# Patient Record
Sex: Male | Born: 1970 | Race: White | Hispanic: No | Marital: Single | Smoking: Current every day smoker
Health system: Southern US, Community
[De-identification: ages and names within clinical notes are randomized; demographics above are authoritative.]

## PROBLEM LIST (undated history)

## (undated) DIAGNOSIS — J449 Chronic obstructive pulmonary disease, unspecified: Secondary | ICD-10-CM

## (undated) DIAGNOSIS — Z87442 Personal history of urinary calculi: Secondary | ICD-10-CM

## (undated) HISTORY — PX: TONSILLECTOMY: SUR1361

---

## 2016-12-16 ENCOUNTER — Other Ambulatory Visit: Payer: Self-pay

## 2016-12-16 ENCOUNTER — Emergency Department (HOSPITAL_COMMUNITY)
Admission: EM | Admit: 2016-12-16 | Discharge: 2016-12-16 | Disposition: A | Discharge status: Home / Self Care | Payer: Worker's Compensation | Attending: Emergency Medicine | Admitting: Emergency Medicine

## 2016-12-16 ENCOUNTER — Emergency Department (HOSPITAL_COMMUNITY): Payer: Worker's Compensation

## 2016-12-16 ENCOUNTER — Encounter (HOSPITAL_COMMUNITY): Payer: Self-pay | Admitting: Emergency Medicine

## 2016-12-16 DIAGNOSIS — R0789 Other chest pain: Secondary | ICD-10-CM | POA: Diagnosis not present

## 2016-12-16 DIAGNOSIS — S299XXA Unspecified injury of thorax, initial encounter: Secondary | ICD-10-CM | POA: Diagnosis present

## 2016-12-16 DIAGNOSIS — F17200 Nicotine dependence, unspecified, uncomplicated: Secondary | ICD-10-CM | POA: Insufficient documentation

## 2016-12-16 DIAGNOSIS — Y92410 Unspecified street and highway as the place of occurrence of the external cause: Secondary | ICD-10-CM | POA: Insufficient documentation

## 2016-12-16 DIAGNOSIS — Y9389 Activity, other specified: Secondary | ICD-10-CM | POA: Diagnosis not present

## 2016-12-16 DIAGNOSIS — R0600 Dyspnea, unspecified: Secondary | ICD-10-CM | POA: Diagnosis not present

## 2016-12-16 DIAGNOSIS — Y990 Civilian activity done for income or pay: Secondary | ICD-10-CM | POA: Insufficient documentation

## 2016-12-16 LAB — CBC WITH DIFFERENTIAL/PLATELET
Basophils Absolute: 0 10*3/uL (ref 0.0–0.1)
Basophils Relative: 0 %
Eosinophils Absolute: 0.1 10*3/uL (ref 0.0–0.7)
Eosinophils Relative: 1 %
HCT: 48.1 % (ref 39.0–52.0)
Hemoglobin: 16.5 g/dL (ref 13.0–17.0)
Lymphocytes Relative: 13 %
Lymphs Abs: 1.4 10*3/uL (ref 0.7–4.0)
MCH: 31.4 pg (ref 26.0–34.0)
MCHC: 34.3 g/dL (ref 30.0–36.0)
MCV: 91.4 fL (ref 78.0–100.0)
Monocytes Absolute: 1 10*3/uL (ref 0.1–1.0)
Monocytes Relative: 9 %
Neutro Abs: 8.5 10*3/uL — ABNORMAL HIGH (ref 1.7–7.7)
Neutrophils Relative %: 77 %
Platelets: 220 10*3/uL (ref 150–400)
RBC: 5.26 MIL/uL (ref 4.22–5.81)
RDW: 12.4 % (ref 11.5–15.5)
WBC: 10.9 10*3/uL — ABNORMAL HIGH (ref 4.0–10.5)

## 2016-12-16 LAB — BASIC METABOLIC PANEL
Anion gap: 6 (ref 5–15)
BUN: 19 mg/dL (ref 6–20)
CO2: 25 mmol/L (ref 22–32)
Calcium: 9.2 mg/dL (ref 8.9–10.3)
Chloride: 107 mmol/L (ref 101–111)
Creatinine, Ser: 1.16 mg/dL (ref 0.61–1.24)
GFR calc Af Amer: >60 mL/min (ref >60)
GFR calc non Af Amer: >60 mL/min (ref >60)
Glucose, Bld: 102 mg/dL — ABNORMAL HIGH (ref 65–99)
Potassium: 3.7 mmol/L (ref 3.5–5.1)
Sodium: 138 mmol/L (ref 135–145)

## 2016-12-16 MED ORDER — IOPAMIDOL (ISOVUE-300) INJECTION 61%
INTRAVENOUS | Status: AC
  Filled 2016-12-16: qty 75

## 2016-12-16 NOTE — Discharge Instructions (Signed)
Please read attached information. If you experience any new or worsening signs or symptoms please return to the emergency room for evaluation. Please follow-up with your primary care provider or specialist as discussed.  Please make sure you inform your primary care provider of incidental findings on your CT scan and need for repeat imaging studies.  Please use medication prescribed only as directed and discontinue taking if you have any concerning signs or symptoms.
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
## 2016-12-16 NOTE — ED Notes (Signed)
To x-ray
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
## 2023-03-19 ENCOUNTER — Emergency Department (HOSPITAL_BASED_OUTPATIENT_CLINIC_OR_DEPARTMENT_OTHER): Payer: Self-pay

## 2023-03-19 ENCOUNTER — Emergency Department (HOSPITAL_BASED_OUTPATIENT_CLINIC_OR_DEPARTMENT_OTHER)
Admission: EM | Admit: 2023-03-19 | Discharge: 2023-03-19 | Discharge status: Against Medical Advice | Payer: Self-pay | Attending: Emergency Medicine | Admitting: Emergency Medicine

## 2023-03-19 ENCOUNTER — Other Ambulatory Visit: Payer: Self-pay

## 2023-03-19 ENCOUNTER — Encounter (HOSPITAL_BASED_OUTPATIENT_CLINIC_OR_DEPARTMENT_OTHER): Payer: Self-pay | Admitting: Radiology

## 2023-03-19 ENCOUNTER — Emergency Department (HOSPITAL_BASED_OUTPATIENT_CLINIC_OR_DEPARTMENT_OTHER): Payer: Self-pay | Admitting: Radiology

## 2023-03-19 ENCOUNTER — Telehealth: Payer: Self-pay | Admitting: Acute Care

## 2023-03-19 DIAGNOSIS — C3411 Malignant neoplasm of upper lobe, right bronchus or lung: Secondary | ICD-10-CM | POA: Insufficient documentation

## 2023-03-19 DIAGNOSIS — F17200 Nicotine dependence, unspecified, uncomplicated: Secondary | ICD-10-CM | POA: Insufficient documentation

## 2023-03-19 DIAGNOSIS — J449 Chronic obstructive pulmonary disease, unspecified: Secondary | ICD-10-CM | POA: Insufficient documentation

## 2023-03-19 DIAGNOSIS — R059 Cough, unspecified: Secondary | ICD-10-CM | POA: Insufficient documentation

## 2023-03-19 LAB — COMPREHENSIVE METABOLIC PANEL
ALT: 31 U/L (ref 0–44)
AST: 28 U/L (ref 15–41)
Albumin: 4 g/dL (ref 3.5–5.0)
Alkaline Phosphatase: 79 U/L (ref 38–126)
Anion gap: 9 (ref 5–15)
BUN: 17 mg/dL (ref 6–20)
CO2: 27 mmol/L (ref 22–32)
Calcium: 9.7 mg/dL (ref 8.9–10.3)
Chloride: 99 mmol/L (ref 98–111)
Creatinine, Ser: 0.77 mg/dL (ref 0.61–1.24)
GFR, Estimated: >60 mL/min (ref >60)
Glucose, Bld: 114 mg/dL — ABNORMAL HIGH (ref 70–99)
Potassium: 3.8 mmol/L (ref 3.5–5.1)
Sodium: 135 mmol/L (ref 135–145)
Total Bilirubin: 0.5 mg/dL (ref 0.0–1.2)
Total Protein: 7.6 g/dL (ref 6.5–8.1)

## 2023-03-19 LAB — CBC WITH DIFFERENTIAL/PLATELET
Abs Immature Granulocytes: 0.02 10*3/uL (ref 0.00–0.07)
Basophils Absolute: 0.1 10*3/uL (ref 0.0–0.1)
Basophils Relative: 1 %
Eosinophils Absolute: 0 10*3/uL (ref 0.0–0.5)
Eosinophils Relative: 0 %
HCT: 39.5 % (ref 39.0–52.0)
Hemoglobin: 13.1 g/dL (ref 13.0–17.0)
Immature Granulocytes: 0 %
Lymphocytes Relative: 12 %
Lymphs Abs: 1.1 10*3/uL (ref 0.7–4.0)
MCH: 30.5 pg (ref 26.0–34.0)
MCHC: 33.2 g/dL (ref 30.0–36.0)
MCV: 91.9 fL (ref 80.0–100.0)
Monocytes Absolute: 1.2 10*3/uL — ABNORMAL HIGH (ref 0.1–1.0)
Monocytes Relative: 13 %
Neutro Abs: 6.9 10*3/uL (ref 1.7–7.7)
Neutrophils Relative %: 74 %
Platelets: 336 10*3/uL (ref 150–400)
RBC: 4.3 MIL/uL (ref 4.22–5.81)
RDW: 12.5 % (ref 11.5–15.5)
WBC: 9.3 10*3/uL (ref 4.0–10.5)
nRBC: 0 % (ref 0.0–0.2)

## 2023-03-19 LAB — RESP PANEL BY RT-PCR (RSV, FLU A&B, COVID)  RVPGX2
Influenza A by PCR: NEGATIVE
Influenza B by PCR: NEGATIVE
Resp Syncytial Virus by PCR: NEGATIVE
SARS Coronavirus 2 by RT PCR: NEGATIVE

## 2023-03-19 MED ORDER — IOHEXOL 350 MG/ML SOLN
100.0000 mL | Freq: Once | INTRAVENOUS | Status: AC | PRN
  Administered 2023-03-19: 75 mL via INTRAVENOUS

## 2023-03-19 MED ORDER — DEXAMETHASONE 4 MG PO TABS: 4.0000 mg | ORAL_TABLET | Freq: Three times a day (TID) | ORAL | 0 refills | Status: DC

## 2023-03-19 NOTE — ED Provider Notes (Signed)
 [place] EMERGENCY DEPARTMENT AT [facility] Provider Note   CSN: [id number] Arrival date & time: 03/19/23  1207     History  Chief Complaint  Patient presents with   Hemoptysis    Eric Foley is a 53 y.o. male with PMHx COPD who presents to ED concerned for hemoptysis. Patient stating that they have been coughing x3-4 days and noticed the hemoptysis x4 this morning. Patient went to UC who found a lung mass and told patient that he has lung cancer but also may have overlying PNA. UC provider ordered patient levofloxacin - patient noticing hives from the levofloxacin and took benadryl and decided not to take this ABX anymore. Patient stating that health insurance does not kick in until March 7th so he cannot set up oncology care until then. Hx of tob use - 1ppd for 41 years.   Denies fever, chest pain, dyspnea, nausea, vomiting, diarrhea, dysuria, hematuria, hematochezia.   HPI     Home Medications Prior to Admission medications   Medication Sig Start Date End Date Taking? Authorizing Provider  dexamethasone (DECADRON) 4 MG tablet Take 1 tablet (4 mg total) by mouth 3 (three) times daily for 20 doses. 03/19/23 03/26/23  Dorthy Cooler, PA-C      Allergies    Patient has no known allergies.    Review of Systems   Review of Systems  Respiratory:  Positive for cough.     Physical Exam Updated Vital Signs BP 118/66 (BP Location: Right Arm)   Pulse (!) 101   Temp 99.7 F (37.6 C) (Oral)   Resp (!) 23   Ht 6' (1.829 m)   Wt 81.6 kg   SpO2 93%   BMI 24.41 kg/m  Physical Exam Vitals and nursing note reviewed.  Constitutional:      General: He is not in acute distress.    Appearance: He is not ill-appearing or toxic-appearing.  HENT:     Head: Normocephalic and atraumatic.     Mouth/Throat:     Mouth: Mucous membranes are moist.  Eyes:     General: No scleral icterus.       Right eye: No discharge.        Left eye: No discharge.      Conjunctiva/sclera: Conjunctivae normal.  Cardiovascular:     Rate and Rhythm: Normal rate and regular rhythm.     Pulses: Normal pulses.     Heart sounds: Normal heart sounds. No murmur heard. Pulmonary:     Effort: Pulmonary effort is normal. No respiratory distress.     Breath sounds: No wheezing, rhonchi or rales.     Comments: Distant breath sounds over right upper lobe Abdominal:     Tenderness: There is no abdominal tenderness.  Musculoskeletal:     Right lower leg: No edema.     Left lower leg: No edema.  Skin:    General: Skin is warm and dry.     Findings: No rash.  Neurological:     General: No focal deficit present.     Mental Status: He is alert and oriented to person, place, and time. Mental status is at baseline.  Psychiatric:        Mood and Affect: Mood normal.     ED Results / Procedures / Treatments   Labs (all labs ordered are listed, but only abnormal results are displayed) Labs Reviewed  CBC WITH DIFFERENTIAL/PLATELET - Abnormal; Notable for the following components:      Result Value  Monocytes Absolute 1.2 (*)    All other components within normal limits  COMPREHENSIVE METABOLIC PANEL - Abnormal; Notable for the following components:   Glucose, Bld 114 (*)    All other components within normal limits  RESP PANEL BY RT-PCR (RSV, FLU A&B, COVID)  [id number]    EKG None  Radiology CT Angio Chest PE W/Cm &/Or Wo Cm Result Date: 03/19/2023 CLINICAL DATA:  Pulmonary embolus suspected with high probability. Cough since the twenty-fourth. Mass in the right lung on radiographs. Coughing up blood this morning. EXAM: CT ANGIOGRAPHY CHEST WITH CONTRAST TECHNIQUE: Multidetector CT imaging of the chest was performed using the standard protocol during bolus administration of intravenous contrast. Multiplanar CT image reconstructions and MIPs were obtained to evaluate the vascular anatomy. RADIATION DOSE REDUCTION: This exam was performed according to the departmental  dose-optimization program which includes automated exposure control, adjustment of the mA and/or kV according to patient size and/or use of iterative reconstruction technique. CONTRAST:  75mL OMNIPAQUE IOHEXOL 350 MG/ML SOLN COMPARISON:  Chest radiograph 03/19/2023.  CT chest 12/16/2016 FINDINGS: Cardiovascular: Technically adequate study with moderately good opacification of the central and segmental pulmonary arteries. Mild motion artifact. No intraluminal filling defects suggesting no evidence of significant pulmonary embolus. The right upper lobe pulmonary arteries are compressed extrinsically by the mass lesion and are displaced inferiorly. Normal heart size. Small pericardial effusions. Normal caliber thoracic aorta. No aortic dissection. Great vessel origins are patent. Mediastinum/Nodes: There is a large right hilar, suprahilar, and paratracheal mass measuring 9 x 10.6 x 12.6 cm. This is new since the prior CT and corresponds to lesion seen on chest radiograph. The mass extends medially to the pretracheal region, displacing the aortic arch to the left. The mass extends anteriorly to cause extrinsic compression of the superior vena cava with minimal flow demonstrated. Correlate for SVC syndrome. The mass extends inferior to the hilum, causing displacement of the right mainstem bronchus inferiorly anterior mediastinal lymphadenopathy measuring 1.8 cm diameter. Posterior right paratracheal lymphadenopathy at the thoracic inlet measuring 2.2 cm diameter. Right supraclavicular lymphadenopathy measuring 2 cm diameter. Esophagus is decompressed. Thyroid gland is unremarkable. Lungs/Pleura: Prominent emphysematous changes in the lungs. Scarring and patchy infiltration in the right upper lung and lingula likely representing postobstructive changes. Focal subpleural nodule or pleural thickening in the right posterior costophrenic angle, measuring 1.3 cm diameter. No pleural effusion or pneumothorax. Upper Abdomen: No  adrenal gland nodules.  No acute abnormalities. Musculoskeletal: No focal bone lesions. Review of the MIP images confirms the above findings. IMPRESSION: 1. Large mass in the right suprahilar region extending to the apex and medial to the mediastinum. This is consistent with malignancy, likely bronchogenic carcinoma. Metastatic lymphadenopathy in the mediastinum and supraclavicular region. Possible pleural metastasis in the right costophrenic angle. 2. Prominent diffuse emphysematous changes with scattered fibrosis. 3. Infiltrates in the right upper lung and lingula likely representing postobstructive change. 4. Small pericardial effusion. 5. No evidence of significant pulmonary embolus although right upper lobe pulmonary arteries are displaced. 6. 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 Electronically Signed   By: Burman Nieves M.D.   On: 03/19/2023 16:56   DG Chest 2 View Result Date: 03/19/2023 CLINICAL DATA:  Cough. EXAM: CHEST - 2 VIEW COMPARISON:  CT scan chest from 11/26/ 2018. FINDINGS: Bilateral lungs appear hyperexpanded and hyperlucent with coarse bronchovascular markings, in keeping with COPD. There is an approximately 9 x 10 cm mass in the right upper lobe, causing mild narrowing  of the lower trachea, compatible with lung neoplasm. Further evaluation with contrast-enhanced chest CT scan is recommended. There are nonspecific opacities in the periphery of the right upper/mid lung zones, which may represent postobstructive pneumonia. Bilateral lungs otherwise appear clear. Bilateral costophrenic angles are clear. Normal cardio-mediastinal silhouette. No acute osseous abnormalities. The soft tissues are within normal limits. IMPRESSION: *There is an approximately 9 x 10 cm mass in the right upper lobe, causing mild narrowing of the lower trachea, compatible with lung neoplasm. Further evaluation with contrast-enhanced chest CT scan is recommended. There are  nonspecific opacities in the periphery of the right upper/mid lung zones, which may represent postobstructive pneumonia. *Background COPD. Electronically Signed   By: Jules Schick M.D.   On: 03/19/2023 14:47    Procedures Procedures    Medications Ordered in ED Medications  iohexol (OMNIPAQUE) 350 MG/ML injection 100 mL (75 mLs Intravenous Contrast Given 03/19/23 1544)    ED Course/ Medical Decision Making/ A&P                                 Medical Decision Making Amount and/or Complexity of Data Reviewed Labs: ordered. Radiology: ordered.  Risk Prescription drug management.   This patient presents to the ED for concern of shortness of breath, this involves an extensive number of treatment options, and is a complaint that carries with it a high risk of complications and morbidity.  The differential diagnosis includes Anxiety, Anaphylaxis/Angioedema, Aspirated FB, Arrhythmia, CHF, Asthma, COPD, PNA, COVID/Flu/RSV, STEMI, Tamponade, TPNX, Sepsis   Co morbidities that complicate the patient evaluation  COPD, smoking hx  Problem List / ED Course / Critical interventions / Medication management  Presents to ED concern for hemoptysis.  Patient diagnosed with probable lung cancer with possible overlying PNA by UC doctor 2 days ago.  Denies any other infectious symptoms currently.  Physical exam with mildly distant breath sounds over the right upper lung.  Rest of physical exam reassuring. I Ordered, and personally interpreted labs.  Respiratory panel negative.  CMP reassuring.  CBC without leukocytosis or anemia. I ordered imaging studies including chest xray/CTA chest to assess for process contributing to patient's symptoms. 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 I agree with the  radiologist interpretation. Consulted with Tessie Fass, NP with pulmonology who recommended admission given the extremely large and dangerous appearance of patient's cancer. Patient declined. Dr. Wallace Cullens and I had an extensive conversation with patient about the severity of his lung mass and the high risks of complication if he was not admitted today.  Patient wants to leave against medical advice. Patient understands that his actions will lead to inadequate medical workup/treatment, and that he is at risk of complications. Patient is demonstrating good capacity to make decision. Patient understands that he needs to return to the ER immediately if his symptoms get worse.  Patient also understands that he needs to come to the ED if he chooses to proceed with treatment given the extent of his lung mass. Consulted on-call oncologist Dr. Arbutus Ped who recommended 4mg  decadron PO TID and will see outpatient. I have reviewed the patients home medicines and have made adjustments as needed Patient signed out AMA.   Social Determinants of Health:  none          Final Clinical Impression(s) / ED  Diagnoses Final diagnoses:  Malignant neoplasm of upper lobe of right lung [facility])    Rx / DC Orders ED Discharge Orders          Ordered    dexamethasone (DECADRON) 4 MG tablet  3 times daily,   Status:  Discontinued        03/19/23 1810    dexamethasone (DECADRON) 4 MG tablet  3 times daily        03/19/23 2015              Dorthy Cooler, [place] 03/19/23 2039    Edwin Dada P, DO 03/29/23 1735

## 2023-03-19 NOTE — Plan of Care (Addendum)
 Addendum:  Have been notified pt is leaving AMA despite recommendations for admission.   I have placed a telephone message to the office requesting appointment ASAP, & have requested that ED PA reach out to oncology now so they they can also get him established.  ____________________   Paged regarding abnormal CTA chest and recs regarding admission vs outpatient follow up.   Large R hilar / suprahilar/ paratracheal mass (9x10.6x12.6cm) with extrinsic SVC compression.  Reportedly no s/sx clinically for SVC syndrome    D/w my attending Dr. Celine Mans who agrees-- recommend admission to [facility], to [facility]. Favor [facility] over [facility] in case radonc is needed urgently.    Will leave on PCCM list. Please call/page the consult pager when patient arrives and we will see in consultation    Tessie Fass MSN, AGACNP-BC [facility] Pulmonary/Critical Care Medicine 03/19/2023, 5:37 PM

## 2023-03-19 NOTE — ED Notes (Signed)
 Patient signed AMA form. Wallace Cullens, MD aware and spoke with patient. Patient understood risk.

## 2023-03-19 NOTE — ED Triage Notes (Signed)
 Cough since 24th, Seen at [facility] and found mass in R lung. States started coughing up blood this morning. Denies chest pain and SOB.

## 2023-03-20 ENCOUNTER — Telehealth: Payer: Self-pay | Admitting: Radiation Oncology

## 2023-03-20 NOTE — Telephone Encounter (Signed)
Called patient to schedule a consultation w. Dr. Manning. No answer, LVM for a return call.  ?

## 2023-03-20 NOTE — Progress Notes (Signed)
 Pt called me back as requested per my VM. Pt states that he is unable to come to see Dr Arbutus Ped tomorrow morning as he has to go to work "so I can keep my job" pt states that his boss is aware and understanding of his situation and is willing to put him on light duty. Pt states his insurance doesn't kick in until 3/7, but would still like to be seen prior to that. I let the pt know that we having an opening on 3/5 at 1:45 for labs and 2:15 to see Dr Arbutus Ped. Pt states he would be able to make that appt. I messaged scheduler requesting the pt be scheduled. I notified the pt that we will also try to get the patient to see Rad Onc as soon as possible as well. I reviewed the location of the cancer center and let the pt know that the lab is in the cancer center and that the pt does not need to fast for her lab work. Pt appreciated information provided.  I messaged Dr Kathrynn Running via [facility] at 2:14 to let him know if the pt had been scheduled for 3/5 and if he would like to see him during his med onc consult given the urgency of the pts condition. Dr Kathrynn Running replied at 2:37 and said that that can be done. Marcello Fennel, PA and Arrayl Rankin-Jordan, RN also added to conversation and aware of the plan for Dr Kathrynn Running to see him at his med onc consult.  I called the pt back at 3:16pm and let him know that he will be seeing Dr Arbutus Ped and Dr Kathrynn Running on the same day. Pt's pulm consult is currently scheduled for 3/26, however we will work to get that moved up. Pt asked how we will find out what it is and what stage. I informed him that he will need a PET scan and brain MRI which will let us know if the cancer has gone anywhere else, which will give [place] his current stage, and a biopsy will let us know what type of lung cancer it is. I encouraged the pt to ask his employer for FMLA paperwork and to bring it with him to his consult. Pt verbalized understanding.

## 2023-03-20 NOTE — Progress Notes (Signed)
 I reached out to the pt at this time to see if he would be available to come to the oncology clinic tomorrow at 10:00 for labs and 10:30 to see Dr Arbutus Ped. No answer. LVM with address for the cancer center, times of the appts, and my direct number with a request to return my call upon receiving the message.

## 2023-03-20 NOTE — Telephone Encounter (Signed)
 Routing to Kandice Robinsons, NP so she is aware. Routing to front desk to schedule pt with Kandice Robinsons.

## 2023-03-21 ENCOUNTER — Other Ambulatory Visit: Payer: Self-pay

## 2023-03-21 DIAGNOSIS — R918 Other nonspecific abnormal finding of lung field: Secondary | ICD-10-CM

## 2023-03-21 NOTE — Progress Notes (Signed)
 Thoracic Location of Tumor / Histology: Malignant neoplasm of Upper Lobe Right Lung  Patient presented as referral from Dr. Arbutus Ped [facility] Medical Oncology)  03/19/2023 Dr. Pricilla Loveless DG Chest 2 View CLINICAL DATA: Cough.   IMPRESSION: *There is an approximately 9 x 10 cm mass in the right upper lobe, causing mild narrowing of the lower trachea, compatible with lung neoplasm. Further evaluation with contrast-enhanced chest CT scan is recommended. There are nonspecific opacities in the periphery of the right upper/mid lung zones, which may represent postobstructive pneumonia.  *Background COPD.  Possible current pregnancy?  Male Is the patient on methotrexate? No  Current Complaints / other details:

## 2023-03-26 ENCOUNTER — Inpatient Hospital Stay: Payer: PRIVATE HEALTH INSURANCE

## 2023-03-26 ENCOUNTER — Inpatient Hospital Stay: Payer: PRIVATE HEALTH INSURANCE | Attending: Internal Medicine | Admitting: Internal Medicine

## 2023-03-26 ENCOUNTER — Ambulatory Visit
Admission: RE | Admit: 2023-03-26 | Discharge: 2023-03-26 | Disposition: A | Discharge status: Home / Self Care | Payer: Self-pay | Source: Ambulatory Visit | Attending: Radiation Oncology | Admitting: Radiation Oncology

## 2023-03-26 VITALS — BP 137/71 | HR 84 | Temp 98.5°F | Resp 18 | Ht 72.0 in | Wt 183.9 lb

## 2023-03-26 DIAGNOSIS — Z510 Encounter for antineoplastic radiation therapy: Secondary | ICD-10-CM | POA: Insufficient documentation

## 2023-03-26 DIAGNOSIS — C3490 Malignant neoplasm of unspecified part of unspecified bronchus or lung: Secondary | ICD-10-CM

## 2023-03-26 DIAGNOSIS — R918 Other nonspecific abnormal finding of lung field: Secondary | ICD-10-CM

## 2023-03-26 DIAGNOSIS — I871 Compression of vein: Secondary | ICD-10-CM | POA: Insufficient documentation

## 2023-03-26 DIAGNOSIS — C3411 Malignant neoplasm of upper lobe, right bronchus or lung: Secondary | ICD-10-CM | POA: Insufficient documentation

## 2023-03-26 LAB — CMP (CANCER CENTER ONLY)
ALT: 27 U/L (ref 0–44)
AST: 13 U/L — ABNORMAL LOW (ref 15–41)
Albumin: 3.9 g/dL (ref 3.5–5.0)
Alkaline Phosphatase: 78 U/L (ref 38–126)
Anion gap: 6 (ref 5–15)
BUN: 17 mg/dL (ref 6–20)
CO2: 33 mmol/L — ABNORMAL HIGH (ref 22–32)
Calcium: 9.8 mg/dL (ref 8.9–10.3)
Chloride: 100 mmol/L (ref 98–111)
Creatinine: 0.94 mg/dL (ref 0.61–1.24)
GFR, Estimated: >60 mL/min (ref >60)
Glucose, Bld: 194 mg/dL — ABNORMAL HIGH (ref 70–99)
Potassium: 4.6 mmol/L (ref 3.5–5.1)
Sodium: 139 mmol/L (ref 135–145)
Total Bilirubin: 0.3 mg/dL (ref 0.0–1.2)
Total Protein: 7 g/dL (ref 6.5–8.1)

## 2023-03-26 LAB — CBC WITH DIFFERENTIAL (CANCER CENTER ONLY)
Abs Immature Granulocytes: 0.81 10*3/uL — ABNORMAL HIGH (ref 0.00–0.07)
Basophils Absolute: 0.1 10*3/uL (ref 0.0–0.1)
Basophils Relative: 0 %
Eosinophils Absolute: 0 10*3/uL (ref 0.0–0.5)
Eosinophils Relative: 0 %
HCT: 45.4 % (ref 39.0–52.0)
Hemoglobin: 14.5 g/dL (ref 13.0–17.0)
Immature Granulocytes: 4 %
Lymphocytes Relative: 6 %
Lymphs Abs: 1.2 10*3/uL (ref 0.7–4.0)
MCH: 30.1 pg (ref 26.0–34.0)
MCHC: 31.9 g/dL (ref 30.0–36.0)
MCV: 94.4 fL (ref 80.0–100.0)
Monocytes Absolute: 1.2 10*3/uL — ABNORMAL HIGH (ref 0.1–1.0)
Monocytes Relative: 6 %
Neutro Abs: 16.4 10*3/uL — ABNORMAL HIGH (ref 1.7–7.7)
Neutrophils Relative %: 84 %
Platelet Count: 510 10*3/uL — ABNORMAL HIGH (ref 150–400)
RBC: 4.81 MIL/uL (ref 4.22–5.81)
RDW: 13.1 % (ref 11.5–15.5)
WBC Count: 19.6 10*3/uL — ABNORMAL HIGH (ref 4.0–10.5)
nRBC: 0 % (ref 0.0–0.2)

## 2023-03-26 MED ORDER — DEXAMETHASONE 4 MG PO TABS: ORAL_TABLET | ORAL | 0 refills | Status: DC

## 2023-03-26 NOTE — Progress Notes (Signed)
 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    ICD-10-CM   1. 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 male seen at the request of Dr. Arbutus Ped.  He has a history of COPD and presented to the urgent care with hemoptysis on 03/19/2023.  A chest x-ray performed at that time showed a 10 cm lung mass in the right upper lobe lung so he was referred to the emergency department for further assessment.  A CT angio chest performed on admission confirmed a large, 12.6 cm right hilar, suprahilar and peritracheal mass, displacing the aortic arch to the left.  The mass extends anteriorly causing extrinsic compression with almost complete occlusion of the SVC.  There was associated thoracic lymphadenopathy in the mediastinum, right paratracheal and right supraclavicular lymph nodes with prominent diffuse emphysematous changes and a small pericardial effusion.  There was no evidence of pulmonary embolism.  The recommendation was for hospital admission for expedited workup/diagnosis but patient refused and left AMA, insisting on outpatient evaluation once his health insurance became active on 03/28/2023 despite counseling regarding the severity of his condition. He has met with Dr. Shirline Frees to discuss obtaining biopsy for tissue confirmation, completing disease staging with PET imaging and MRI brain scan and an initial discussion regarding potential systemic treatment options.  He has been kindly referred to [place] to discuss potential radiation  treatment options, including the role for urgent chest radiotherapy to prevent impending SVC and further airway compromise.   PREVIOUS RADIATION THERAPY: No  PAST MEDICAL HISTORY: No past medical history on file.    PAST SURGICAL HISTORY:No past surgical history on file.  FAMILY HISTORY: No family history on file.  SOCIAL HISTORY:  Social History   Socioeconomic History   Marital status: Single    Spouse name: Not on file   Number of children: Not on file   Years of education: Not on file   Highest education level: Not on file  Occupational History   Not on file  Tobacco Use   Smoking status: Every Day   Smokeless tobacco: Never  Substance and Sexual Activity   Alcohol use: No   Drug use: No   Sexual activity: Not on file  Other Topics Concern   Not on file  Social History Narrative   Not on file   Social Drivers of Health   Financial Resource Strain: Not on file  Food Insecurity: Not on file  Transportation Needs: Not on file  Physical Activity: Not on file  Stress: Not on file  Social Connections: Unknown (06/04/2021)   Received from [facility]   Social Network    Social Network: Not on file  Intimate Partner Violence: Unknown (04/26/2021)   Received from [facility]   HITS    Physically Hurt: Not on file    Insult or Talk Down To: Not on file    Threaten Physical Harm: Not on file    Scream or Curse: Not on file  ALLERGIES: Patient has no known allergies.  MEDICATIONS:  Current Outpatient Medications  Medication Sig Dispense Refill   dexamethasone (DECADRON) 4 MG tablet 1 tablet p.o. twice daily for 7 days followed by 1 tablet p.o. daily for 7 days then half a tablet p.o. daily until completion of the current refill. 30 tablet 0   No current facility-administered medications for this encounter.    REVIEW OF SYSTEMS:  On review of systems, the patient reports that he is doing well overall. he denies any chest pain, shortness of breath, cough,  fevers, chills, night sweats, unintended weight changes. he denies any bowel or bladder disturbances, and denies abdominal pain, nausea or vomiting. he denies any new musculoskeletal or joint aches or pains. A complete review of systems is obtained and is otherwise negative.    PHYSICAL EXAM:  Wt Readings from Last 3 Encounters:  03/26/23 183 lb 14.4 oz (83.4 kg)  03/19/23 180 lb (81.6 kg)  12/16/16 200 lb (90.7 kg)   Temp Readings from Last 3 Encounters:  03/26/23 98.5 F (36.9 C) (Temporal)  03/19/23 99.7 F (37.6 C) (Oral)  12/16/16 98.2 F (36.8 C) (Oral)   BP Readings from Last 3 Encounters:  03/26/23 137/71  03/19/23 118/66  12/16/16 126/89   Pulse Readings from Last 3 Encounters:  03/26/23 84  03/19/23 (!) 101  12/16/16 89    /10  In general this is a well appearing guy in no acute distress. He's alert and oriented x4 and appropriate throughout the examination. Cardiopulmonary assessment is negative for acute distress and breathing exhibits normal effort.   He does have some mild facial swelling and congested venous collateral vessels on the anterior upper chest consistent with SVC obstruction  KPS = 70  100 - Normal; no complaints; no evidence of disease. 90   - Able to carry on normal activity; minor signs or symptoms of disease. 80   - Normal activity with effort; some signs or symptoms of disease. 23   - Cares for self; unable to carry on normal activity or to do active work. 60   - Requires occasional assistance, but is able to care for most of his personal needs. 50   - Requires considerable assistance and frequent medical care. 40   - Disabled; requires special care and assistance. 30   - Severely disabled; hospital admission is indicated although death not imminent. 20   - Very sick; hospital admission necessary; active supportive treatment necessary. 10   - Moribund; fatal processes progressing rapidly. 0     - Dead  Karnofsky DA, Abelmann WH, Craver LS  and Burchenal [facility] [phone]) The use of the nitrogen mustards in the palliative treatment of carcinoma: with particular reference to bronchogenic carcinoma Cancer 1 [id number]  LABORATORY DATA:  Lab Results  Component Value Date   WBC 19.6 (H) 03/26/2023   HGB 14.5 03/26/2023   HCT 45.4 03/26/2023   MCV 94.4 03/26/2023   PLT 510 (H) 03/26/2023   Lab Results  Component Value Date   NA 139 03/26/2023   K 4.6 03/26/2023   CL 100 03/26/2023   CO2 33 (H) 03/26/2023   Lab Results  Component Value Date   ALT 27 03/26/2023   AST 13 (L) 03/26/2023   ALKPHOS 78 03/26/2023   BILITOT 0.3 03/26/2023     RADIOGRAPHY: CT Angio Chest PE W/Cm &/Or Wo Cm Result Date: 03/19/2023 CLINICAL DATA:  Pulmonary embolus suspected with high probability. Cough since the twenty-fourth. Mass in the right  lung on radiographs. Coughing up blood this morning. EXAM: CT ANGIOGRAPHY CHEST WITH CONTRAST TECHNIQUE: Multidetector CT imaging of the chest was performed using the standard protocol during bolus administration of intravenous contrast. Multiplanar CT image reconstructions and MIPs were obtained to evaluate the vascular anatomy. RADIATION DOSE REDUCTION: This exam was performed according to the departmental dose-optimization program which includes automated exposure control, adjustment of the mA and/or kV according to patient size and/or use of iterative reconstruction technique. CONTRAST:  75mL OMNIPAQUE IOHEXOL 350 MG/ML SOLN COMPARISON:  Chest radiograph 03/19/2023.  CT chest 12/16/2016 FINDINGS: Cardiovascular: Technically adequate study with moderately good opacification of the central and segmental pulmonary arteries. Mild motion artifact. No intraluminal filling defects suggesting no evidence of significant pulmonary embolus. The right upper lobe pulmonary arteries are compressed extrinsically by the mass lesion and are displaced inferiorly. Normal heart size. Small pericardial effusions. Normal caliber thoracic aorta.  No aortic dissection. Great vessel origins are patent. Mediastinum/Nodes: There is a large right hilar, suprahilar, and paratracheal mass measuring 9 x 10.6 x 12.6 cm. This is new since the prior CT and corresponds to lesion seen on chest radiograph. The mass extends medially to the pretracheal region, displacing the aortic arch to the left. The mass extends anteriorly to cause extrinsic compression of the superior vena cava with minimal flow demonstrated. Correlate for SVC syndrome. The mass extends inferior to the hilum, causing displacement of the right mainstem bronchus inferiorly anterior mediastinal lymphadenopathy measuring 1.8 cm diameter. Posterior right paratracheal lymphadenopathy at the thoracic inlet measuring 2.2 cm diameter. Right supraclavicular lymphadenopathy measuring 2 cm diameter. Esophagus is decompressed. Thyroid gland is unremarkable. Lungs/Pleura: Prominent emphysematous changes in the lungs. Scarring and patchy infiltration in the right upper lung and lingula likely representing postobstructive changes. Focal subpleural nodule or pleural thickening in the right posterior costophrenic angle, measuring 1.3 cm diameter. No pleural effusion or pneumothorax. Upper Abdomen: No adrenal gland nodules.  No acute abnormalities. Musculoskeletal: No focal bone lesions. Review of the MIP images confirms the above findings. IMPRESSION: 1. Large mass in the right suprahilar region extending to the apex and medial to the mediastinum. This is consistent with malignancy, likely bronchogenic carcinoma. Metastatic lymphadenopathy in the mediastinum and supraclavicular region. Possible pleural metastasis in the right costophrenic angle. 2. Prominent diffuse emphysematous changes with scattered fibrosis. 3. Infiltrates in the right upper lung and lingula likely representing postobstructive change. 4. Small pericardial effusion. 5. No evidence of significant pulmonary embolus although right upper lobe pulmonary  arteries are displaced. 6. 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 Electronically Signed   By: Burman Nieves M.D.   On: 03/19/2023 16:56   DG Chest 2 View Result Date: 03/19/2023 CLINICAL DATA:  Cough. EXAM: CHEST - 2 VIEW COMPARISON:  CT scan chest from 11/26/ 2018. FINDINGS: Bilateral lungs appear hyperexpanded and hyperlucent with coarse bronchovascular markings, in keeping with COPD. There is an approximately 9 x 10 cm mass in the right upper lobe, causing mild narrowing of the lower trachea, compatible with lung neoplasm. Further evaluation with contrast-enhanced chest CT scan is recommended. There are nonspecific opacities in the periphery of the right upper/mid lung zones, which may represent postobstructive pneumonia. Bilateral lungs otherwise appear clear. Bilateral costophrenic angles are clear. Normal cardio-mediastinal silhouette. No acute osseous abnormalities. The soft tissues are within normal limits. IMPRESSION: *There is an approximately 9 x 10 cm mass in the right upper lobe, causing mild narrowing of the lower trachea, compatible with lung neoplasm.  Further evaluation with contrast-enhanced chest CT scan is recommended. There are nonspecific opacities in the periphery of the right upper/mid lung zones, which may represent postobstructive pneumonia. *Background COPD. Electronically Signed   By: Jules Schick M.D.   On: 03/19/2023 14:47      IMPRESSION/PLAN: 1. 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  Ideally, he needs urgent bronchoscopy, PET and brain MRI to complete his diagnostic and staging work-up, assuming his respiratory status remains stable.  If however, he develops worsening facial/arm swelling or hemoptysis, we may not have the luxury to delay radiation therapy.  I advised him of the signs of worsening disease that may warrant  emergent radiotherapy.  Today, I talked to the patient and family about the findings and work-up thus far.  We discussed the natural history of locally advanced lung cancer and SVC syndrome and general treatment, highlighting the role of radiotherapy in the management.  We discussed the available radiation techniques, and focused on the details of logistics and delivery.  We reviewed the anticipated acute and late sequelae associated with radiation in this setting.  The patient was encouraged to ask questions that I answered to the best of my ability.    The patient would like to proceed with pulmonary consult and radiologic staging.  I personally spent 60 minutes in this encounter including chart review, reviewing radiological studies, meeting face-to-face with the patient, entering orders and completing documentation.    Marguarite Arbour, PA-C    Margaretmary Dys, MD  [facility]  Radiation Oncology Direct Dial: [phone]  Fax: [phone] [email or web address]  Skype  LinkedIn

## 2023-03-26 NOTE — Progress Notes (Signed)
 [facility] [phone]   [phone]  CONSULT NOTE  REFERRING PHYSICIAN: Valrie Hart, PA-C  REASON FOR CONSULTATION:  53 years old white male with highly suspicious lung cancer  HPI Eric Foley is a 53 y.o. male came to the clinic today for initial evaluation of suspicious lung cancer accompanied by his wife, Trula Ore. Discussed the use of AI scribe software for clinical note transcription with the patient, who gave verbal consent to proceed.  History of Present Illness   Eric Foley is a 54 year old male with a right lung mass who presents with respiratory symptoms and hemoptysis. He is accompanied by his wife, Trula Ore.  He has a history of a large mass on his right lung, discovered during a recent emergency room visit. Initially, he sought medical attention due to feeling unwell, experiencing chest discomfort, and producing green phlegm. An urgent care visit led to a preliminary diagnosis of possible lung cancer or pneumonia based on an x-ray, and he was prescribed levofloxacin. After starting levofloxacin, he developed hives and discontinued the medication. Subsequently, he experienced hemoptysis, initially thought to be irritation, but it progressed to coughing up solid blood, prompting an ER visit. A CXR revealed a 9x10 cm mass in the right upper lobe. CTA of the chest  showed a large right hilar, suprahilar, andparatracheal mass measuring 9 x 10.6 x 12.6 cm. This is new since the prior CT and corresponds to lesion seen on chest radiograph. The mass extends medially to the pretracheal region, displacing the aortic arch to the left. The mass extends anteriorly to cause extrinsic compression of the superior vena cava with minimal flow demonstrated. Metastatic lymphadenopathy in the mediastinum and supraclavicular region. Possible pleural metastasis in the right costophrenic angle.  Currently, he feels well with no significant chest pain or  cough today, although he had some chest pain and hemoptysis yesterday, which led to being sent home from work. No nausea, vomiting, diarrhea, headaches, or vision changes. He notes occasional lightheadedness and shortness of breath with exertion. He has gained weight recently, attributed to improved appetite since starting Decadron, which he takes at 4 mg three times a day.  His past medical history includes COPD. Family history is notable for his mother having survived colon cancer and his father having died from emphysema and black lung. He has a brother with COPD. 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 He is allergic to ampicillin, amoxicillin, and penicillin.       Social History Social History   Tobacco Use   Smoking status: Every Day   Smokeless tobacco: Never  Substance Use Topics   Alcohol use: No   Drug use: No    No Known Allergies  Current Outpatient Medications  Medication Sig Dispense Refill   dexamethasone (DECADRON) 4 MG tablet Take 1 tablet (4 mg total) by mouth 3 (three) times daily for 20 doses. 20 tablet 0   No current facility-administered medications for this visit.    Review of Systems  Constitutional: positive for fatigue Eyes: negative Ears, nose, mouth, throat, and face: negative Respiratory: positive for cough and dyspnea on exertion Cardiovascular: negative Gastrointestinal: negative Genitourinary:negative Integument/breast: negative Hematologic/lymphatic: negative Musculoskeletal:negative Neurological: negative Behavioral/Psych: negative Endocrine: negative Allergic/Immunologic: negative  Physical Exam  [id number], healthy, no distress, well nourished, and well developed SKIN: skin color, texture, turgor are normal, no  rashes or significant lesions HEAD: Normocephalic, No masses, lesions, tenderness or abnormalities EYES:  normal, PERRLA, Conjunctiva are pink and non-injected EARS: External ears normal, Canals clear OROPHARYNX:no exudate, no erythema, and lips, buccal mucosa, and tongue normal  NECK: supple, no adenopathy, no JVD LYMPH:  no palpable lymphadenopathy, no hepatosplenomegaly LUNGS: clear to auscultation , and palpation HEART: regular rate & rhythm, no murmurs, and no gallops ABDOMEN:abdomen soft, non-tender, normal bowel sounds, and no masses or organomegaly BACK: Back symmetric, no curvature., No CVA tenderness EXTREMITIES:no joint deformities, effusion, or inflammation, no edema  NEURO: alert & oriented x 3 with fluent speech, no focal motor/sensory deficits  PERFORMANCE STATUS: ECOG 1  LABORATORY DATA: Lab Results  Component Value Date   WBC 19.6 (H) 03/26/2023   HGB 14.5 03/26/2023   HCT 45.4 03/26/2023   MCV 94.4 03/26/2023   PLT 510 (H) 03/26/2023      Chemistry      Component Value Date/Time   NA 139 03/26/2023 1350   K 4.6 03/26/2023 1350   CL 100 03/26/2023 1350   CO2 33 (H) 03/26/2023 1350   BUN 17 03/26/2023 1350   CREATININE 0.94 03/26/2023 1350      Component Value Date/Time   CALCIUM 9.8 03/26/2023 1350   ALKPHOS 78 03/26/2023 1350   AST 13 (L) 03/26/2023 1350   ALT 27 03/26/2023 1350   BILITOT 0.3 03/26/2023 1350       RADIOGRAPHIC STUDIES: CT Angio Chest PE W/Cm &/Or Wo Cm Result Date: 03/19/2023 CLINICAL DATA:  Pulmonary embolus suspected with high probability. Cough since the twenty-fourth. Mass in the right lung on radiographs. Coughing up blood this morning. EXAM: CT ANGIOGRAPHY CHEST WITH CONTRAST TECHNIQUE: Multidetector CT imaging of the chest was performed using the standard protocol during bolus administration of intravenous contrast. Multiplanar CT image reconstructions and MIPs were obtained to evaluate the vascular anatomy. RADIATION DOSE REDUCTION: This exam was performed according to the departmental dose-optimization program which includes  automated exposure control, adjustment of the mA and/or kV according to patient size and/or use of iterative reconstruction technique. CONTRAST:  75mL OMNIPAQUE IOHEXOL 350 MG/ML SOLN COMPARISON:  Chest radiograph 03/19/2023.  CT chest 12/16/2016 FINDINGS: Cardiovascular: Technically adequate study with moderately good opacification of the central and segmental pulmonary arteries. Mild motion artifact. No intraluminal filling defects suggesting no evidence of significant pulmonary embolus. The right upper lobe pulmonary arteries are compressed extrinsically by the mass lesion and are displaced inferiorly. Normal heart size. Small pericardial effusions. Normal caliber thoracic aorta. No aortic dissection. Great vessel origins are patent. Mediastinum/Nodes: There is a large right hilar, suprahilar, and paratracheal mass measuring 9 x 10.6 x 12.6 cm. This is new since the prior CT and corresponds to lesion seen on chest radiograph. The mass extends medially to the pretracheal region, displacing the aortic arch to the left. The mass extends anteriorly to cause extrinsic compression of the superior vena cava with minimal flow demonstrated. Correlate for SVC syndrome. The mass extends inferior to the hilum, causing displacement of the right mainstem bronchus inferiorly anterior mediastinal lymphadenopathy measuring 1.8 cm diameter. Posterior right paratracheal lymphadenopathy at the thoracic inlet measuring 2.2 cm diameter. Right supraclavicular lymphadenopathy measuring 2 cm diameter. Esophagus is decompressed. Thyroid gland is unremarkable. Lungs/Pleura: Prominent emphysematous changes in the lungs. Scarring and patchy infiltration in the right upper lung and lingula likely representing postobstructive changes. Focal subpleural nodule or pleural thickening in the right posterior costophrenic angle, measuring 1.3 cm diameter. No pleural effusion or pneumothorax.  Upper Abdomen: No adrenal gland nodules.  No acute  abnormalities. Musculoskeletal: No focal bone lesions. Review of the MIP images confirms the above findings. IMPRESSION: 1. Large mass in the right suprahilar region extending to the apex and medial to the mediastinum. This is consistent with malignancy, likely bronchogenic carcinoma. Metastatic lymphadenopathy in the mediastinum and supraclavicular region. Possible pleural metastasis in the right costophrenic angle. 2. Prominent diffuse emphysematous changes with scattered fibrosis. 3. Infiltrates in the right upper lung and lingula likely representing postobstructive change. 4. Small pericardial effusion. 5. No evidence of significant pulmonary embolus although right upper lobe pulmonary arteries are displaced. 6. 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 Electronically Signed   By: Burman Nieves M.D.   On: 03/19/2023 16:56   DG Chest 2 View Result Date: 03/19/2023 CLINICAL DATA:  Cough. EXAM: CHEST - 2 VIEW COMPARISON:  CT scan chest from 11/26/ 2018. FINDINGS: Bilateral lungs appear hyperexpanded and hyperlucent with coarse bronchovascular markings, in keeping with COPD. There is an approximately 9 x 10 cm mass in the right upper lobe, causing mild narrowing of the lower trachea, compatible with lung neoplasm. Further evaluation with contrast-enhanced chest CT scan is recommended. There are nonspecific opacities in the periphery of the right upper/mid lung zones, which may represent postobstructive pneumonia. Bilateral lungs otherwise appear clear. Bilateral costophrenic angles are clear. Normal cardio-mediastinal silhouette. No acute osseous abnormalities. The soft tissues are within normal limits. IMPRESSION: *There is an approximately 9 x 10 cm mass in the right upper lobe, causing mild narrowing of the lower trachea, compatible with lung neoplasm. Further evaluation with contrast-enhanced chest CT scan is recommended. There are nonspecific opacities in the  periphery of the right upper/mid lung zones, which may represent postobstructive pneumonia. *Background COPD. Electronically Signed   By: Jules Schick M.D.   On: 03/19/2023 14:47    ASSESSMENT AND PLAN:     Suspected Lung Cancer A large mass measuring 9x10 cm is present in the right upper lobe of the lung, with possible metastasis to the right supraclavicular lymph node and pleural space. The mass is causing significant anatomical changes, including displacement of the superior vena cava, leading to facial and arm swelling. Differential diagnosis includes small cell, non-small cell, and neuroendocrine carcinoma. He has a smoking history, a risk factor for lung cancer. Symptoms include intermittent chest pain, coughing, and occasional hemoptysis, but no significant weight loss. Decadron has improved his energy levels and appetite. Immediate radiation therapy is necessary to prevent airway obstruction and alleviate symptoms, even before biopsy results are available. - Refer to pulmonologist for bronchoscopy and biopsy to determine the type of lung cancer. - Order PET scan and MRI of the brain to assess for metastasis and staging. - Initiate radiation therapy to alleviate symptoms and prevent airway obstruction. - Continue Decadron with a tapering schedule: 2 mg twice daily for one week, then 1 mg daily.  Chronic Obstructive Pulmonary Disease (COPD) COPD is likely exacerbated by smoking. He reports occasional dyspnea and lightheadedness with exertion. No evidence of acute exacerbation. - Encourage smoking cessation to prevent further deterioration of lung function.   The patient was advised to call immediately if he has any concerning symptoms in the interval.  The patient voices understanding of current disease status and treatment options and is in agreement with the current care plan.  All questions were answered. The patient knows to call the clinic with any problems, questions or concerns. We  can certainly see  the patient much sooner if necessary.  Thank you so much for allowing me to participate in the care of Andria Rhein. I will continue to follow up the patient with you and assist in his care.  The total time spent in the appointment was 60 minutes.  Disclaimer: This note was dictated with voice recognition software. Similar sounding words can inadvertently be transcribed and may not be corrected upon review.   Lajuana Matte March 26, 2023, 2:34 PM

## 2023-03-26 NOTE — Progress Notes (Signed)
 I met the pt today face to face at his consult with Dr. Arbutus Ped. Pt was accompanied by his wife, Eric Foley. The plan for the pt is to complete his staging work up complete with a PET scan and brain MRI. I reviewed the instructions for a PET scan. Pt understands he can only have water 6hrs prior to the PET scan.   Pt is scheduled to see Pulm on 3/13 for a consult and to schedule biopsy. I will reach out to scheduling at [facility] to see if his appt can be moved up. Patient will follow up with Dr Arbutus Ped in approx 2 weeks after his imaging complete.  Dr Kathrynn Running also came up to consult on the pt.  I escorted the pt and his wife to scheduling and a f/u appt was made for the pt to see C.Heilingoetter, Onc PA and Dr Arbutus Ped. I provided the pt with my business card and asked they call me with any concerns.  I called central scheduling after the conclusion of the appt and was able to schedule the pt's brain MRI for 3/8 at 1pm at [facility], and his PET scan on 3/11 at 4pm at [facility]. I reached out to the pt to give him dates and times, however pt states he will just check his MyChart.

## 2023-03-27 ENCOUNTER — Inpatient Hospital Stay: Payer: PRIVATE HEALTH INSURANCE | Admitting: Licensed Clinical Social Worker

## 2023-03-27 DIAGNOSIS — C3490 Malignant neoplasm of unspecified part of unspecified bronchus or lung: Secondary | ICD-10-CM

## 2023-03-27 NOTE — Progress Notes (Signed)
 [facility] Clinical Social Work  Initial Assessment   Eric Foley is a 53 y.o. year old male contacted by phone. Clinical Social Work was referred by medical provider for assessment of psychosocial needs.   SDOH (Social Determinants of Health) assessments performed: Yes   SDOH Screenings   Social Connections: Unknown (06/04/2021)   Received from [facility]  Tobacco Use: High Risk (03/19/2023)     Distress Screen completed: No     No data to display            Family/Social Information:  Housing Arrangement: patient lives with his wife and their two daughters who are ages 49 and 64.   Pt's wife is not employed. Family members/support persons in your life? Pt reports he has support if needed. Transportation concerns: No Employment: Working full time pt works as a Charity fundraiser.  Per pt he has worked at his current job for approximately 1 yr.  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  Pt also did not have insurance coverage through his company, but was able to sign up for a policy which begins on 03/28/2023.  Income source: Employment Financial concerns: Yes, due to illness and/or loss of work during treatment Type of concern: Medical bills Food access concerns: no Religious or spiritual practice: Not known Advanced directives: Not known Services Currently in place:  none  Coping/ Adjustment to diagnosis: Patient understands treatment plan and what happens next? Pt has not yet completed diagnostics.  It is anticipated pt will undergo radiation/ chemotherapy is pending PET and biopsy results. Concerns about diagnosis and/or treatment: Losing my job and/or losing income, Overwhelmed by information, and How I will pay for the services I need Patient reported stressors: Finances and Adjusting to my illness Hopes and/or priorities: Pt's priority is to finish diagnostics and start treatment w/ the hope of positive results. Patient enjoys time  with family/ friends Current coping skills/ strengths: Capable of independent living , Motivation for treatment/growth , Physical Health , and Supportive family/friends     SUMMARY: Current SDOH Barriers:  Financial constraints related to lack of insurance coverage at initial diagnosis.  Clinical Social Work Clinical Goal(s):  Explore community resource options for unmet needs related to:  Financial Strain   Interventions: Discussed common feeling and emotions when being diagnosed with cancer, and the importance of support during treatment Informed patient of the support team roles and support services at [facility] Provided CSW contact information and encouraged patient to call with any questions or concerns Provided patient with information about children's resources.  Pt also provided w/ information regarding applying for a hardship settlement if needed for outstanding medical bills.  Pt will reach out to CSW regarding financial concerns once medical bills are received.     Follow Up Plan: Patient will contact CSW with any support or resource needs Patient verbalizes understanding of plan: Yes    Rachel Moulds, LCSW Clinical Social Worker [facility]

## 2023-03-29 ENCOUNTER — Ambulatory Visit (HOSPITAL_COMMUNITY): Admission: RE | Admit: 2023-03-29 | Source: Ambulatory Visit

## 2023-04-01 ENCOUNTER — Encounter (HOSPITAL_COMMUNITY): Admission: RE | Admit: 2023-04-01 | Payer: Self-pay | Source: Ambulatory Visit

## 2023-04-02 NOTE — Progress Notes (Signed)
 Marlana Salvage from Authorizations reached out to me about pt's insurance status via [place]. I called the pt and he was able to reach me off the information on his card.  [address], policy #[id number]. Insurance is through [employer]. I passed this information to Ms Robby Sermon, however she was unable to find him in [employer] system. She asked for the 800 number on the pt's insurance card. I called the pt back but call went to VM. I left a VM with Ms Robby Sermon direct number and asked him to call either her or myself back.

## 2023-04-03 ENCOUNTER — Encounter: Payer: Self-pay | Admitting: Emergency Medicine

## 2023-04-03 ENCOUNTER — Institutional Professional Consult (permissible substitution): Payer: Self-pay | Admitting: Emergency Medicine

## 2023-04-03 ENCOUNTER — Telehealth: Payer: Self-pay

## 2023-04-03 ENCOUNTER — Other Ambulatory Visit: Payer: Self-pay | Admitting: Emergency Medicine

## 2023-04-03 DIAGNOSIS — R918 Other nonspecific abnormal finding of lung field: Secondary | ICD-10-CM

## 2023-04-03 NOTE — Progress Notes (Signed)
 Called pt. Asked him to provide me the contact numbers on his insurance card as he can't be found in the [employer] provider system. Pt was driving at the time and couldn't stop to look at his card. I asked him to call me when he has a moment to take a break to call me so I can get that information so I can provide it to authorizations. I informed him that without it, he would have to pay for treatment out of pocket. Pt verbalized understanding.

## 2023-04-03 NOTE — Telephone Encounter (Signed)
 Talked to [facility] regarding his appt w/ Dr. Delton Coombes today. Pt forgot he had appt today and I called pt to see if we could figure out a day to get him in the office to be evaluated by Dr. Delton Coombes, pt explained how he can not miss work and will be unable to take another day off due to work. Dr. Delton Coombes advised me to ask the pt if he would be okay with having a Bronchoscopy done due to his conditions, pt agreed that he thinks it would be the best option due to his health and current being. Pt verbalized understanding to Dr. Neville Route concerns to having this procedure done asap. Pt also verbalized understanding to being scheduled 3/24 for Bronchoscopy procedure and will meet with Dr. Delton Coombes the day of procedure. Pt needs to be contacted for further details regarding procedure time and details.

## 2023-04-03 NOTE — Telephone Encounter (Signed)
 Thank you. Bronch request orders placed

## 2023-04-03 NOTE — Progress Notes (Signed)
 The patient was unable to attend his office visit today because he could not leave work.  I have reviewed his imaging.  Clearly needs bronchoscopy with biopsies ASAP.  Will work on scheduling him for 04/14/2023.  He understands that this will be under general anesthesia, that he will need a designated driver, that he will need someone to watch him at home after the procedure.

## 2023-04-07 ENCOUNTER — Encounter (HOSPITAL_COMMUNITY)
Admission: RE | Admit: 2023-04-07 | Discharge: 2023-04-07 | Disposition: A | Discharge status: Home / Self Care | Source: Ambulatory Visit | Attending: Internal Medicine | Admitting: Internal Medicine

## 2023-04-07 ENCOUNTER — Ambulatory Visit (HOSPITAL_COMMUNITY)
Admission: RE | Admit: 2023-04-07 | Discharge: 2023-04-07 | Disposition: A | Discharge status: Home / Self Care | Source: Ambulatory Visit | Attending: Internal Medicine | Admitting: Internal Medicine

## 2023-04-07 DIAGNOSIS — C3490 Malignant neoplasm of unspecified part of unspecified bronchus or lung: Secondary | ICD-10-CM | POA: Insufficient documentation

## 2023-04-07 LAB — GLUCOSE, CAPILLARY: Glucose-Capillary: 122 mg/dL — ABNORMAL HIGH (ref 70–99)

## 2023-04-07 MED ORDER — GADOBUTROL 1 MMOL/ML IV SOLN
8.0000 mL | Freq: Once | INTRAVENOUS | Status: AC | PRN
  Administered 2023-04-07: 8 mL via INTRAVENOUS

## 2023-04-07 MED ORDER — FLUDEOXYGLUCOSE F - 18 (FDG) INJECTION
9.1100 | Freq: Once | INTRAVENOUS | Status: AC | PRN
  Administered 2023-04-07: 9.11 via INTRAVENOUS

## 2023-04-07 NOTE — Progress Notes (Deleted)
 [facility] OFFICE PROGRESS NOTE  Patient, No Pcp Per No address on file  DIAGNOSIS: Suspicious lung cancer but still requires biopsy. 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 Differential diagnosis includes small cell, non-small cell, and neuroendocrine carcinoma.   PRIOR THERAPY: None  CURRENT THERAPY: None ***  INTERVAL HISTORY: Eric Foley 53 y.o. male returns to clinic today for follow-up visit.  He establish care in the clinic on 03/26/2023 for suspicious lung cancer.  However the patient has not had his biopsy.  There has been some issues with insurance approval for his biopsy to be performed through [facility].  He was supposed to have a bronchoscopy on 04/14/2023.  [facility]?  He completed the staging workup with a PET scan and brain MRI which she is here to review today.  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 The plan is ***  He is also been following with social work.  The patient has 12 children and has some financial concerns.  Otherwise, he denies any significant changes in his health.  He denies any fever, chills, night sweats, or unexplained weight loss.  Denies any chest pain or or cough.  Shortness of breath?  Swelling in the upper extremities and face?  He denies any nausea, vomiting, diarrhea, or constipation.  Denies any headache or visual changes.  Lightheadedness?  Is here today to review his results and discuss next steps.   MEDICAL HISTORY:No past medical history on file.  ALLERGIES:  has no known allergies.  MEDICATIONS:  Current Outpatient Medications  Medication Sig Dispense Refill   dexamethasone  (DECADRON ) 4 MG tablet 1 tablet p.o. twice daily for 7 days followed by 1 tablet p.o. daily for 7 days then half a tablet p.o. daily until completion of the current refill. 30 tablet 0   No  current facility-administered medications for this visit.    SURGICAL HISTORY: No past surgical history on file.  REVIEW OF SYSTEMS:   Review of Systems  Constitutional: Negative for appetite change, chills, fatigue, fever and unexpected weight change.  HENT:   Negative for mouth sores, nosebleeds, sore throat and trouble swallowing.   Eyes: Negative for eye problems and icterus.  Respiratory: Negative for cough, hemoptysis, shortness of breath and wheezing.   Cardiovascular: Negative for chest pain and leg swelling.  Gastrointestinal: Negative for abdominal pain, constipation, diarrhea, nausea and vomiting.  Genitourinary: Negative for bladder incontinence, difficulty urinating, dysuria, frequency and hematuria.   Musculoskeletal: Negative for back pain, gait problem, neck pain and neck stiffness.  Skin: Negative for itching and rash.  Neurological: Negative for dizziness, extremity weakness, gait problem, headaches, light-headedness and seizures.  Hematological: Negative for adenopathy. Does not bruise/bleed easily.  Psychiatric/Behavioral: Negative for confusion, depression and sleep disturbance. The patient is not nervous/anxious.     PHYSICAL EXAMINATION:  There were no vitals taken for this visit.  ECOG PERFORMANCE STATUS: {CHL ONC ECOG [id number]  Physical Exam  Constitutional: Oriented to person, place, and time and well-developed, well-nourished, and in no distress. No distress.  HENT:  Head: Normocephalic and atraumatic.  Mouth/Throat: Oropharynx is clear and moist. No oropharyngeal exudate.  Eyes: Conjunctivae are normal. Right eye exhibits no discharge. Left eye exhibits no discharge. No scleral icterus.  Neck: Normal range of motion. Neck supple.  Cardiovascular: Normal rate, regular rhythm, normal heart sounds and intact distal pulses.  Pulmonary/Chest: Effort normal and breath sounds normal. No respiratory distress. No wheezes. No rales.  Abdominal: Soft. Bowel  sounds are normal. Exhibits no distension and no mass. There is no tenderness.  Musculoskeletal: Normal range of motion. Exhibits no edema.  Lymphadenopathy:    No cervical adenopathy.  Neurological: Alert and oriented to person, place, and time. Exhibits normal muscle tone. Gait normal. Coordination normal.  Skin: Skin is warm and dry. No rash noted. Not diaphoretic. No erythema. No pallor.  Psychiatric: Mood, memory and judgment normal.  Vitals reviewed.  LABORATORY DATA: Lab Results  Component Value Date   WBC 19.6 (H) 03/26/2023   HGB 14.5 03/26/2023   HCT 45.4 03/26/2023   MCV 94.4 03/26/2023   PLT 510 (H) 03/26/2023      Chemistry      Component Value Date/Time   NA 139 03/26/2023 1350   K 4.6 03/26/2023 1350   CL 100 03/26/2023 1350   CO2 33 (H) 03/26/2023 1350   BUN 17 03/26/2023 1350   CREATININE 0.94 03/26/2023 1350      Component Value Date/Time   CALCIUM 9.8 03/26/2023 1350   ALKPHOS 78 03/26/2023 1350   AST 13 (L) 03/26/2023 1350   ALT 27 03/26/2023 1350   BILITOT 0.3 03/26/2023 1350       RADIOGRAPHIC STUDIES:  CT Angio Chest PE W/Cm &/Or Wo Cm Result Date: 03/19/2023 CLINICAL DATA:  Pulmonary embolus suspected with high probability. Cough since the twenty-fourth. Mass in the right lung on radiographs. Coughing up blood this morning. EXAM: CT ANGIOGRAPHY CHEST WITH CONTRAST TECHNIQUE: Multidetector CT imaging of the chest was performed using the standard protocol during bolus administration of intravenous contrast. Multiplanar CT image reconstructions and MIPs were obtained to evaluate the vascular anatomy. RADIATION DOSE REDUCTION: This exam was performed according to the departmental dose-optimization program which includes automated exposure control, adjustment of the mA and/or kV according to patient size and/or use of iterative reconstruction technique. CONTRAST:  75mL OMNIPAQUE  IOHEXOL  350 MG/ML SOLN COMPARISON:  Chest radiograph 03/19/2023.  CT chest  12/16/2016 FINDINGS: Cardiovascular: Technically adequate study with moderately good opacification of the central and segmental pulmonary arteries. Mild motion artifact. No intraluminal filling defects suggesting no evidence of significant pulmonary embolus. The right upper lobe pulmonary arteries are compressed extrinsically by the mass lesion and are displaced inferiorly. Normal heart size. Small pericardial effusions. Normal caliber thoracic aorta. No aortic dissection. Great vessel origins are patent. Mediastinum/Nodes: There is a large right hilar, suprahilar, and paratracheal mass measuring 9 x 10.6 x 12.6 cm. This is new since the prior CT and corresponds to lesion seen on chest radiograph. The mass extends medially to the pretracheal region, displacing the aortic arch to the left. The mass extends anteriorly to cause extrinsic compression of the superior vena cava with minimal flow demonstrated. Correlate for SVC syndrome. The mass extends inferior to the hilum, causing displacement of the right mainstem bronchus inferiorly anterior mediastinal lymphadenopathy measuring 1.8 cm diameter. Posterior right paratracheal lymphadenopathy at the thoracic inlet measuring 2.2 cm diameter. Right supraclavicular lymphadenopathy measuring 2 cm diameter. Esophagus is decompressed. Thyroid gland is unremarkable. Lungs/Pleura: Prominent emphysematous changes in the lungs. Scarring and patchy infiltration in the right upper lung and lingula likely representing postobstructive changes. Focal subpleural nodule or pleural thickening in the right posterior costophrenic angle, measuring 1.3 cm diameter. No pleural effusion or pneumothorax. Upper Abdomen: No adrenal gland nodules.  No acute abnormalities. Musculoskeletal: No focal bone lesions. Review of the MIP images confirms  the above findings. IMPRESSION: 1. Large mass in the right suprahilar region extending to the apex and medial to the mediastinum. This is consistent with  malignancy, likely bronchogenic carcinoma. Metastatic lymphadenopathy in the mediastinum and supraclavicular region. Possible pleural metastasis in the right costophrenic angle. 2. Prominent diffuse emphysematous changes with scattered fibrosis. 3. Infiltrates in the right upper lung and lingula likely representing postobstructive change. 4. Small pericardial effusion. 5. No evidence of significant pulmonary embolus although right upper lobe pulmonary arteries are displaced. 6. 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 Electronically Signed   By: Boyce Byes M.D.   On: 03/19/2023 16:56   DG Chest 2 View Result Date: 03/19/2023 CLINICAL DATA:  Cough. EXAM: CHEST - 2 VIEW COMPARISON:  CT scan chest from 11/26/ 2018. FINDINGS: Bilateral lungs appear hyperexpanded and hyperlucent with coarse bronchovascular markings, in keeping with COPD. There is an approximately 9 x 10 cm mass in the right upper lobe, causing mild narrowing of the lower trachea, compatible with lung neoplasm. Further evaluation with contrast-enhanced chest CT scan is recommended. There are nonspecific opacities in the periphery of the right upper/mid lung zones, which may represent postobstructive pneumonia. Bilateral lungs otherwise appear clear. Bilateral costophrenic angles are clear. Normal cardio-mediastinal silhouette. No acute osseous abnormalities. The soft tissues are within normal limits. IMPRESSION: *There is an approximately 9 x 10 cm mass in the right upper lobe, causing mild narrowing of the lower trachea, compatible with lung neoplasm. Further evaluation with contrast-enhanced chest CT scan is recommended. There are nonspecific opacities in the periphery of the right upper/mid lung zones, which may represent postobstructive pneumonia. *Background COPD. Electronically Signed   By: Beula Brunswick M.D.   On: 03/19/2023 14:47     ASSESSMENT/PLAN:  This is a very pleasant 53 year old  Caucasian male diagnosed with suspected lung cancer but his bronchoscopy is scheduled for next week on 04/14/2023.  The patient presented with a right upper lobe lung mass with possible metastasis to the right supraclavicular lymph node and pleural space.  The mass is causing displacement of the superior vena cava leading to facial and arm swelling. ***Head scan and brain MRI.  The patient is working with Dr. Lacinda Pica team about scheduling bronchoscopy although they ran into some hiccups with insurance.  This is currently scheduled for 04/14/2023.  He also met with Dr. Lorri Rota who is ***  Dr. Marguerita Shih personally and independently reviewed the PET scan.  Dr. Marguerita Shih also reviewed the brain MRI.  Dr. Marguerita Shih reviewed the results with the patient today.  The scan showed ***  In order to discuss systemic therapy options, the patient will require a biopsy.  This is scheduled for next week.  It may take a couple days to receive the results of his biopsy.  Therefore we will see him a few days after the biopsy to review the results and have a more detailed discussion about systemic therapy options.  He will continue to follow closely with social work.  Decadron ?  MM's last note says 2 mg daily for 1 week followed by 1 mg daily for 1 week.  The patient was advised to call immediately if she has any concerning symptoms in the interval. The patient voices understanding of current disease status and treatment options and is in agreement with the current care plan. All questions were answered. The patient knows to call the clinic with any problems, questions or concerns. We can certainly see the patient much sooner if necessary  No orders of the defined types were placed in this encounter.    I spent {CHL ONC TIME VISIT - LOVFI:[id number]} counseling the patient face to face. The total time spent in the appointment was {CHL ONC TIME VISIT - ZYSAY:[id number]}.  Elvert Cumpton L Dorthy Hustead, PA-C 04/07/23

## 2023-04-08 ENCOUNTER — Telehealth: Payer: Self-pay | Admitting: Emergency Medicine

## 2023-04-08 NOTE — Telephone Encounter (Signed)
 Spoke to the patient he states he does not have the [employer] he has [employer] states he has not had the BCBS in years and wants [place] to try the [employer]

## 2023-04-08 NOTE — Telephone Encounter (Signed)
 Lmam for the patient to call back and let us know what he wants to do

## 2023-04-08 NOTE — Telephone Encounter (Signed)
 Please let the pt know that we received this and that we will refer him to be seen at [facility] if that's what he would prefer. Certainly it sounds like that would be best for his insurance. I can work on referral once we know what he wants to do

## 2023-04-10 ENCOUNTER — Other Ambulatory Visit: Payer: Self-pay

## 2023-04-10 ENCOUNTER — Encounter (HOSPITAL_COMMUNITY): Payer: Self-pay | Admitting: Emergency Medicine

## 2023-04-10 ENCOUNTER — Inpatient Hospital Stay: Payer: PRIVATE HEALTH INSURANCE | Admitting: Physician Assistant

## 2023-04-10 ENCOUNTER — Inpatient Hospital Stay: Payer: PRIVATE HEALTH INSURANCE

## 2023-04-10 ENCOUNTER — Telehealth: Payer: Self-pay | Admitting: Internal Medicine

## 2023-04-10 NOTE — Telephone Encounter (Signed)
 Patient states he hasn't had bcbs since 2018 and has not had this in years and states the other insurance has paid for all the other stuff and there is no reason we should be filing the bcbs

## 2023-04-10 NOTE — Progress Notes (Signed)
 Pt called me at 2:07 today to let me know he wasn't coming to his appt. He also informed me that the last 2 days he has had episodes of his R hand and sometimes from his elbow down cramping "like a charlie horse". Pt denies swelling to the arm. I told him I would let Dr Arbutus Ped know.  After discussing with Dr Arbutus Ped, I also let Dr Kathrynn Running know via [facility]. Dr Kathrynn Running stated it could be being caused by the mass in his lung. Pt has his biopsy scheduled for 3/24 at 12:15. I asked Ashlyn and Dr Kathrynn Running if the pt could have a SIM appt that morning prior to his bronch. Ashlyn states there is an 8:30am SIM appt available and he should be finished in time to get to [facility] by 10:15 for his pre procedural appt before his bronch. I called the pt back and asked if he could make the 8:30 am SIM appt and he states he could. Rad Onc notified.I also offered to schedule him for his follow up appt with Dr Arbutus Ped or [facility] to go over his imaging and biopsy results. Pt states the appt has to be in the afternoon. First available afternoon appt was on 3/31 at 3 or 3:30 with Cassie. Pt chose 3:30. I let the pt know he would need labs prior at 3:00pm. Pt verbalized understanding and agreement to plan.  No additional questions or concerns at this time.

## 2023-04-10 NOTE — Progress Notes (Signed)
 PCP - No PCP Cardiologist -   PPM/ICD - denies Device Orders - n/a Rep Notified - n/a  Chest x-ray - 03-19-23 EKG - DOS Stress Test - Denies ECHO - Denies Cardiac Cath - denies  Dm - denies  Blood Thinner Instructions: denies Aspirin Instructions: n/a  ERAS Protcol - NPO  COVID TEST- n/a   Anesthesia review: no  Patient verbally denies any shortness of breath, fever, cough and chest pain during phone call   -------------  SDW INSTRUCTIONS given:  Your procedure is scheduled on April 14, 2023.  Report to [facility] Main Entrance "A" at 9:45 A.M., and check in at the Admitting office.  Call this number if you have problems the morning of surgery:  [phone]   Remember:  Do not eat  or drink after midnight the night before your surgery    Take these medicines the morning of surgery with A SIP OF WATER  dexamethasone (DECADRON)   As of today, STOP taking any Aspirin (unless otherwise instructed by your surgeon) Aleve, Naproxen, Ibuprofen, Motrin, Advil, Goody's, BC's, all herbal medications, fish oil, and all vitamins.                      Do not wear jewelry, make up, or nail polish            Do not wear lotions, powders, perfumes/colognes, or deodorant.            Do not shave 48 hours prior to surgery.  Men may shave face and neck.            Do not bring valuables to the hospital.            [facility] is not responsible for any belongings or valuables.  Do NOT Smoke (Tobacco/Vaping) 24 hours prior to your procedure If you use a CPAP at night, you may bring all equipment for your overnight stay.   Contacts, glasses, dentures or bridgework may not be worn into surgery.      For patients admitted to the hospital, discharge time will be determined by your treatment team.   Patients discharged the day of surgery will not be allowed to drive home, and someone needs to stay with them for 24 hours.    Special instructions:   [place]- Preparing For  Surgery  Before surgery, you can play an important role. Because skin is not sterile, your skin needs to be as free of germs as possible. You can reduce the number of germs on your skin by washing with CHG (chlorahexidine gluconate) Soap before surgery.  CHG is an antiseptic cleaner which kills germs and bonds with the skin to continue killing germs even after washing.    Oral Hygiene is also important to reduce your risk of infection.  Remember - BRUSH YOUR TEETH THE MORNING OF SURGERY WITH YOUR REGULAR TOOTHPASTE  Please do not use if you have an allergy to CHG or antibacterial soaps. If your skin becomes reddened/irritated stop using the CHG.  Do not shave (including legs and underarms) for at least 48 hours prior to first CHG shower. It is OK to shave your face.  Please follow these instructions carefully.   Shower the NIGHT BEFORE SURGERY and the MORNING OF SURGERY with DIAL Soap.   Pat yourself dry with a CLEAN TOWEL.  Wear CLEAN PAJAMAS to bed the night before surgery  Place CLEAN SHEETS on your bed the night of your first shower  and DO NOT SLEEP WITH PETS.   Day of Surgery: Please shower morning of surgery  Wear Clean/Comfortable clothing the morning of surgery Do not apply any deodorants/lotions.   Remember to brush your teeth WITH YOUR REGULAR TOOTHPASTE.   Questions were answered. Patient verbalized understanding of instructions.

## 2023-04-10 NOTE — Telephone Encounter (Signed)
 Patient unwilling to schedule MD visit at this time due to work schedule. Stressed to patient that a later appt can be secured buy patient is not willing to work with scheduling to be able to have follow up to go over pathology. APP and navigator have been made aware.

## 2023-04-11 NOTE — Telephone Encounter (Signed)
 Star from [facility]  is calling. Patient has BCBS and it will not cover his surgery since it is out of network. Patient has been notified by Star that it would be best for him to cancel the procedure or drop the BCBS since it wont be covered. I told Star that the patient has been advised to go to [facility] since its in his network. Patient has been very assertive and insists that he does not have BCBS. Star would like a call back to know how to proceed with this patient.    [phone] option [phone]

## 2023-04-11 NOTE — Telephone Encounter (Signed)
 Per Star-Shakimah confirmed PA was not required with [employer], but does not think a pre-determination had been completed.   Called & spoke to [place] F w/ [employer] [facility] who confirmed that prior Berkley Harvey is not required, but a pre-determination will need to be completed prior to procedure.  Jamse Arn confirmed that this has not been initiated.  I have initiated the pre-determination & will let pt know that bronch will need to be rescheduled as the determination will not be completed until WED or THURS of next week.  Dr. Delton Coombes has been notified,

## 2023-04-14 ENCOUNTER — Encounter (HOSPITAL_COMMUNITY): Payer: Self-pay | Admitting: Certified Registered Nurse Anesthetist

## 2023-04-14 ENCOUNTER — Ambulatory Visit (HOSPITAL_COMMUNITY)
Admission: RE | Admit: 2023-04-14 | Payer: PRIVATE HEALTH INSURANCE | Source: Home / Self Care | Admitting: Emergency Medicine

## 2023-04-14 ENCOUNTER — Encounter (HOSPITAL_COMMUNITY): Admission: RE | Payer: Self-pay | Source: Home / Self Care

## 2023-04-14 ENCOUNTER — Ambulatory Visit
Admission: RE | Admit: 2023-04-14 | Discharge: 2023-04-14 | Disposition: A | Discharge status: Home / Self Care | Payer: PRIVATE HEALTH INSURANCE | Source: Ambulatory Visit | Attending: Radiation Oncology | Admitting: Radiation Oncology

## 2023-04-14 DIAGNOSIS — C3411 Malignant neoplasm of upper lobe, right bronchus or lung: Secondary | ICD-10-CM | POA: Insufficient documentation

## 2023-04-14 DIAGNOSIS — I871 Compression of vein: Secondary | ICD-10-CM

## 2023-04-14 DIAGNOSIS — Z510 Encounter for antineoplastic radiation therapy: Secondary | ICD-10-CM | POA: Insufficient documentation

## 2023-04-14 HISTORY — DX: Chronic obstructive pulmonary disease, unspecified: J44.9

## 2023-04-14 HISTORY — DX: Personal history of urinary calculi: Z87.442

## 2023-04-14 SURGERY — ENDOBRONCHIAL ULTRASOUND (EBUS)
Anesthesia: General | Laterality: Right

## 2023-04-14 NOTE — Telephone Encounter (Signed)
 We had to postpone his procedure today. If I need to refer him to [facility] to be evaluated for insurance reasons then we can try to make an urgent referral

## 2023-04-15 ENCOUNTER — Ambulatory Visit: Payer: PRIVATE HEALTH INSURANCE

## 2023-04-15 NOTE — Telephone Encounter (Signed)
 Spoke with Eric Foley. Pt states his insurance is not blue cross blue shield. He says he is currently paying for his health insurance and its through [facility]. Pt is very upset because he states he has never went through [facility] for anything. Pt is starting radiation treatment today and his main concern is getting his insurance figured out so he can have the biopsy procedure. Pt did not want [place] to put a referral in to [facility] and stated that to me 3x. Please advise.

## 2023-04-15 NOTE — Telephone Encounter (Signed)
 It is unclear to me why this barrier has come up. If he is willing to reschedule bronchoscopy regardless of the insurance question, then please ask PCC's to work on getting him into my next available slot

## 2023-04-15 NOTE — Telephone Encounter (Signed)
 Jeanice Lim has initiated the pre determation for patients insurance on Monday before we schedule this procedure again

## 2023-04-15 NOTE — Progress Notes (Signed)
  Radiation Oncology         [phone] ________________________________  Name: Eric Foley MRN: [id number]  Date: 04/14/2023  DOB: Sep 26, 1970  SIMULATION AND TREATMENT PLANNING NOTE    ICD-10-CM   1. 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  NARRATIVE:  The patient was brought to the CT Simulation planning suite.  Identity was confirmed.  All relevant records and images related to the planned course of therapy were reviewed.  The patient freely provided informed written consent to proceed with treatment after reviewing the details related to the planned course of therapy. The consent form was witnessed and verified by the simulation staff.  Then, the patient was set-up in a stable reproducible  supine position for radiation therapy.  CT images were obtained.  Surface markings were placed.  The CT images were loaded into the planning software.  Then the target and avoidance structures were contoured.  Treatment planning then occurred.  The radiation prescription was entered and confirmed.  Then, I designed and supervised the construction of a total of 6 medically necessary complex treatment devices, including a BodyFix immobilization mold custom fitted to the patient along with 5 multileaf collimators conformally shaped radiation around the treatment target while shielding critical structures such as the heart and spinal cord maximally.  I have requested : 3D Simulation  I have requested a DVH of the following structures: Left lung, right lung, spinal cord, heart, esophagus, and target.  I have ordered:Nutrition Consult  SPECIAL TREATMENT PROCEDURE:  The planned course of therapy using radiation constitutes a special treatment procedure. Special care is required in the management of this patient for the following reasons.  The patient will be receiving  concurrent chemotherapy requiring careful monitoring for increased toxicities of treatment including periodic laboratory values.  The special nature of the planned course of radiotherapy will require increased physician supervision and oversight to ensure patient's safety with optimal treatment outcomes.  PLAN:  The patient will receive 75 Gy in 35 fractions depending on biopsy and staging.  ________________________________  Artist Pais. Kathrynn Running, M.D.

## 2023-04-15 NOTE — Progress Notes (Unsigned)
 [facility] OFFICE PROGRESS NOTE  Patient, No Pcp Per No address on file  DIAGNOSIS: Suspicious lung cancer but still requires biopsy. 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 Differential diagnosis includes small cell, non-small cell, and neuroendocrine carcinoma.   PRIOR THERAPY: None  CURRENT THERAPY:  1). Radiation to the SVC syndrome under the care of Dr. Kathrynn Running.  His last day radiation is tentatively scheduled for 06/03/2023  INTERVAL HISTORY: Eric Foley 53 y.o. male returns to the clinic today for a follow-up visit.  He establish care in the clinic on 03/26/2023 for suspicious lung cancer.  However, the patient has not had his biopsy due to insurance approval for his biopsy. He was supposed to have a bronchoscopy on 04/14/2023 but this was cancelled due to the patient having insurance concerns and has been rescheduled for tomorrow on 04/22/23. The patient is aware of the importance of this appointment and he will be attending the procedure as scheduled.   He completed the staging workup with a PET scan and brain MRI which she is here to review today.  He also saw a radiation oncology with Dr. Kathrynn Running for the SUV syndrome. The plan is to receive radiation directed at the large lung mass.  The patient is status post 3 treatments.  The last day radiation is tentatively scheduled for 06/03/2023.  The patient overall does not have any complaints today except he reports that he really would like to be considered for steroids.  He previously was on 4 mg of Decadron on a taper for his SVC syndrome.  He reports that since being off the steroids he has been struggling with weakness, pain, and cramping in his legs.  Also has been helping his appetite.  He works as a Naval architect.  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  He had already previously talked to social work.   The patient is hopeful that he can continue working.  His weight is roughly stable since he was last weighed on 03/26/2023.  Otherwise he denies any fever or chills.  He had 2 episodes of night sweats since last being seen.  Overall his breathing is "good".  He may have some days better than others.  He does sometimes cough more following radiation.  He reports he is not able to take any over-the-counter cough medications due to his job and driving.  Therefore he may use a cough drop.  He has had some mild blood-tinged mucus production but denies any frank hemoptysis.  He was already advised by radiation oncology that should he cough up large volume of blood to seek emergency evaluation.  He denies any nausea, vomiting, diarrhea, or constipation.  He still continues to have some distention of his neck vessels due to his SVC syndrome which may be improving a little bit since last being seen.  He is here today for evaluation to review his scan results.  MEDICAL HISTORY: Past Medical History:  Diagnosis Date   COPD (chronic obstructive pulmonary disease) (HCC)    History of kidney stones     ALLERGIES:  is allergic to penicillins, amoxicillin, and ampicillin-sulbactam sodium.  MEDICATIONS:  Current Outpatient Medications  Medication Sig Dispense Refill   predniSONE (DELTASONE) 10 MG tablet Take 1 tablet (10 mg total) by mouth daily with breakfast. 10 tablet 0   Aspirin-Acetaminophen (GOODYS BODY PAIN PO) Take by mouth 2 (  two) times daily.     Potassium 99 MG TABS Take 99 mg by mouth 2 (two) times daily.     No current facility-administered medications for this visit.    SURGICAL HISTORY:  Past Surgical History:  Procedure Laterality Date   TONSILLECTOMY      REVIEW OF SYSTEMS:   Review of Systems  Constitutional: Positive for fatigue and decreased appetite. Negative for chills, fever and unexpected weight change.  HENT: Negative for mouth sores, nosebleeds, sore throat and trouble  swallowing.   Eyes: Negative for eye problems and icterus.  Respiratory:Positive for intermittent shortness of breath and cough.  Negative for  hemoptysis and wheezing.   Cardiovascular: Negative for chest pain and leg swelling.  Gastrointestinal: Negative for abdominal pain, constipation, diarrhea, nausea and vomiting.  Genitourinary: Negative for bladder incontinence, difficulty urinating, dysuria, frequency and hematuria.   Musculoskeletal: Positive for cramping in the lower extremities.  Negative for back pain, gait problem, neck pain and neck stiffness.  Skin: Negative for itching and rash.  Neurological: Positive for generalized weakness in the lower extremities.  Negative for dizziness, extremity weakness, gait problem, headaches, light-headedness and seizures.  Hematological: Negative for adenopathy. Does not bruise/bleed easily.  Psychiatric/Behavioral: Negative for confusion, depression and sleep disturbance. The patient is not nervous/anxious.     PHYSICAL EXAMINATION:  Blood pressure 135/69, pulse (!) 108, temperature 98.1 F (36.7 C), temperature source Temporal, resp. rate 16, weight 182 lb 1.6 oz (82.6 kg), SpO2 98%.  ECOG PERFORMANCE STATUS: 1  Physical Exam  Constitutional: Oriented to person, place, and time and well-developed, well-nourished, and in no distress.  HENT:  Head: Normocephalic and atraumatic.  Mouth/Throat: Oropharynx is clear and moist. No oropharyngeal exudate.  Eyes: Conjunctivae are normal. Right eye exhibits no discharge. Left eye exhibits no discharge. No scleral icterus.  Neck: Normal range of motion. Neck supple.  Cardiovascular: Normal rate, regular rhythm, normal heart sounds and intact distal pulses.   Pulmonary/Chest: Effort normal.  Quiet breath sounds bilaterally. no respiratory distress. No wheezes. No rales.  Abdominal: Soft. Bowel sounds are normal. Exhibits no distension and no mass. There is no tenderness.  Musculoskeletal: Normal range  of motion. Exhibits no edema.  Lymphadenopathy:    No cervical adenopathy. Positive for some distension in his neck blood vessels.  Neurological: Alert and oriented to person, place, and time. Exhibits normal muscle tone. Gait normal. Coordination normal.  Skin: Skin is warm and dry. No rash noted. Not diaphoretic. No erythema. No pallor.  Psychiatric: Mood, memory and judgment normal.  Vitals reviewed.  LABORATORY DATA: Lab Results  Component Value Date   WBC 5.5 04/21/2023   HGB 15.0 04/21/2023   HCT 46.2 04/21/2023   MCV 93.1 04/21/2023   PLT 230 04/21/2023      Chemistry      Component Value Date/Time   NA 139 03/26/2023 1350   K 4.6 03/26/2023 1350   CL 100 03/26/2023 1350   CO2 33 (H) 03/26/2023 1350   BUN 17 03/26/2023 1350   CREATININE 0.94 03/26/2023 1350      Component Value Date/Time   CALCIUM 9.8 03/26/2023 1350   ALKPHOS 78 03/26/2023 1350   AST 13 (Foley) 03/26/2023 1350   ALT 27 03/26/2023 1350   BILITOT 0.3 03/26/2023 1350       RADIOGRAPHIC STUDIES:  MR BRAIN W WO CONTRAST Result Date: 04/09/2023 CLINICAL DATA:  Provided history: Malignant neoplasm of unspecified part of unspecified bronchus or lung. Non-small cell lung cancer, staging. EXAM:  MRI HEAD WITHOUT AND WITH CONTRAST TECHNIQUE: Multiplanar, multiecho pulse sequences of the brain and surrounding structures were obtained without and with intravenous contrast. CONTRAST:  8mL GADAVIST GADOBUTROL 1 MMOL/ML IV SOLN COMPARISON:  PET CT 04/07/2023. FINDINGS: Brain: No age-advanced or lobar predominant cerebral atrophy. Mild multifocal T2 FLAIR hyperintense signal abnormality within the cerebral white matter, nonspecific but most often secondary to chronic small vessel ischemia. Chronic lacunar infarct versus prominent perivascular space within the right basal ganglia inferiorly. No cortical encephalomalacia is identified. There is no acute infarct. No evidence of an intracranial mass. No chronic intracranial  blood products. No extra-axial fluid collection. No midline shift. No pathologic intracranial enhancement identified. Vascular: Maintained flow voids within the proximal large arterial vessels. Skull and upper cervical spine: No focal worrisome marrow lesion. Sinuses/Orbits: No mass or acute finding within the imaged orbits. No significant paranasal sinus disease. Other: Trace fluid within left mastoid air cells. Ovoid 2.4 cm cystic-appearing cutaneous/subcutaneous right facial lesion with associated restricted diffusion. This is nonspecific, but may reflect an epidermal inclusion cyst. IMPRESSION: 1. No evidence of intracranial metastatic disease. 2. Chronic lacunar infarct versus prominent perivascular space within the right basal ganglia inferiorly. 3. Mild T2 FLAIR hyperintense signal changes within the cerebral white matter, nonspecific but most often secondary to chronic small vessel ischemia. 4. 2.4 cm cystic-appearing cutaneous/subcutaneous right facial, nonspecific but possibly reflecting an epidermal inclusion cyst. Direct examination recommended. Electronically Signed   By: Jackey Loge D.O.   On: 04/09/2023 14:51   NM PET Image Initial (PI) Skull Base To Thigh (F-18 FDG) Result Date: 04/09/2023 CLINICAL DATA:  Initial treatment strategy for lung nodule. EXAM: NUCLEAR MEDICINE PET SKULL BASE TO THIGH TECHNIQUE: 9.1 mCi F-18 FDG was injected intravenously. Full-ring PET imaging was performed from the skull base to thigh after the radiotracer. CT data was obtained and used for attenuation correction and anatomic localization. Fasting blood glucose: 122 mg/dl COMPARISON:  CT chest 16/10/9602 and 12/16/2016. FINDINGS: Mediastinal blood pool activity: SUV max 1.8 Liver activity: SUV max NA NECK: No abnormal hypermetabolism. Incidental CT findings: None. CHEST: Hypermetabolic right upper lobe mass invades the mediastinum, crosses the midline and measures 10.0 x 13.3 cm, SUV 52.4. Hypermetabolic lymph nodes  extend to the low left internal jugular station, measuring 8 mm (4/55), SUV max 2.9. Subpleural nodule in the medial right lower lobe measures 8 mm (7/43), SUV max 11.3. Additional subpleural/pleural nodules in the posterior base of the right hemithorax, SUV max 19.0. Focal hypermetabolism associated with the distal esophagus, SUV max 6.7, possibly with a luminal nodule on CT chest 03/19/2023. Incidental CT findings: Atherosclerotic calcification of the aorta with age advanced involvement of the left anterior descending coronary artery. Heart size normal. No pericardial effusion. No pleural effusion. Centrilobular and paraseptal emphysema. ABDOMEN/PELVIS: Hypermetabolic 6 mm ventral pelvic peritoneal nodule (4/195), SUV max 16.1. No additional abnormal hypermetabolism. Incidental CT findings: Low-attenuation lesions in the left kidney. No specific follow-up necessary. Small bilateral inguinal hernias contain fat. SKELETON: No abnormal hypermetabolism. Incidental CT findings: Degenerative changes in the spine. IMPRESSION: 1. Large right upper lobe mass with mediastinal invasion, right pleural/subpleural involvement and left supraclavicular adenopath, findings compatible with at least T4 N3 M0 or stage IIIC disease. However, the hypermetabolic pelvic peritoneal nodule is worrisome for stage IV disease. 2. Focal hypermetabolism associated with the distal esophagus, worrisome for a distal esophageal lesion. Consider esophagram or upper endoscopy in further evaluation, as clinically indicated. 3. Age advanced left anterior descending coronary artery calcification. 4.  Aortic atherosclerosis (ICD10-I70.0). 5.  Emphysema (ICD10-J43.9). Electronically Signed   By: Leanna Battles M.D.   On: 04/09/2023 14:23     ASSESSMENT/PLAN:  This is a very pleasant 53 year old Caucasian male diagnosed with suspected stage IIIC (T4, N3, M0) lung cancer but his bronchoscopy is scheduled for next week on 04/14/2023.  The patient  presented with a right upper lobe lung mass with possible metastasis to the right supraclavicular lymph node and pleural space.  The mass is causing displacement of the superior vena cava leading to facial and arm swelling.  Head scan and brain MRI.  The patient is scheduled for bronchoscopy and biopsy tomorrow on 04/22/2023.    He also met with Dr. Kathrynn Running who has started radiation to the large lung mass/SVC syndrome and he is scheduled for this until 06/03/23  Dr. Arbutus Ped personally and independently reviewed the PET scan.  Dr. Arbutus Ped also reviewed the brain MRI.  Dr. Arbutus Ped reviewed the results with the patient today.  The scan showed large right upper lobe with mediastinal invasion, right pleural/subpleural involvement and left supraclavicular adenopathy compatible with at least T4, N3, M0 or stage IIIc disease.  There is a hypermetabolic pelvic peritoneal node that is worrisome for stage IV disease but unclear.  There is also some focal hypermetabolism in the distal esophagus which could be a esophageal lesion Dr. Arbutus Ped recommends monitoring that for now.  We could consider radiation to the pelvic peritoneal node if needed.  Dr. Arbutus Ped still would recommend getting the biopsy so we can determine systemic treatment.  The patient is scheduled for this tomorrow.  It may take a couple days to receive the results of his biopsy.  Therefore we will see him a few days after the biopsy to review the results and have a more detailed discussion about systemic therapy options.  The patient states that his job is accommodating.  He is wanting to continue working if at all possible I let him know I am more than happy to write him a work note in order for him to attend his doctors appointments and treatments and imaging in a timely manner to avoid delays in his care which could have consequences on his health and outcomes. The patient expressed understanding.   After lengthy discussion, the patient would  strongly like to be on steroids to help with his appetite and weakness.  Until he can be seen next week for a follow-up we will send in 10 mg of prednisone p.o. daily.  We will see him back for labs and follow-up visit next week after the biopsy for more detailed discussion about his current condition and treatment at that time.  The patient was advised to call immediately if she has any concerning symptoms in the interval. The patient voices understanding of current disease status and treatment options and is in agreement with the current care plan. All questions were answered. The patient knows to call the clinic with any problems, questions or concerns. We can certainly see the patient much sooner if necessary   No orders of the defined types were placed in this encounter.    Eric Foley Paarth Cropper, PA-C 04/21/23  ADDENDUM: Hematology/Oncology Attending:  I had a face-to-face encounter with the patient today.  I reviewed his record, lab, scan and recommended his care plan.  This is a very pleasant 53 years old white male with highly suspicious stage IIIc/IV lung cancer pending tissue diagnosis send presented with large right upper lobe lung mass with metastasis  with the right supraclavicular lymph node as well as pleural space with SVC syndrome diagnosed in March 2024.  The patient is currently undergoing radiotherapy to the large right upper lobe lung mass with mediastinal invasion under the care of Dr. Kathrynn Running is expected to have bronchoscopy tomorrow after he canceled his previous appointment.  He had a PET scan as well as MRI of the brain performed recently.  I personally and independently reviewed the images studies and discussed the results with the patient today.  His MRI of the brain showed no clear evidence for metastatic disease to the brain but the PET scan showed a large right upper lobe mass with mediastinal invasion in addition to right pleural and subpleural involvement and left  supraclavicular adenopathy.  There was also hypermetabolic pelvic peritoneal nodule worrisome for metastatic disease.  I recommended for the patient to strongly keep his appointment for the bronchoscopy tomorrow.  I will see him back for follow-up visit in around 10 days for evaluation and discussion of future treatment options based on the final pathology. He will continue his palliative radiotherapy to the large right upper lobe mass for now. The patient has been complaining of increasing fatigue and weakness and he would like to continue on steroids if possible.  Will keep him on prednisone 10 mg p.o. daily for now until improvement of the large right upper lobe mass. He was advised to call immediately if he has any other concerning symptoms in the interval. The total time spent in the appointment was 30 minutes. Disclaimer: This note was dictated with voice recognition software. Similar sounding words can inadvertently be transcribed and may be missed upon review. Lajuana Matte, MD

## 2023-04-15 NOTE — Telephone Encounter (Signed)
 Routing to [facility], please advise

## 2023-04-16 ENCOUNTER — Other Ambulatory Visit: Payer: Self-pay

## 2023-04-16 ENCOUNTER — Ambulatory Visit
Admission: RE | Admit: 2023-04-16 | Discharge: 2023-04-16 | Discharge status: Home / Self Care | Payer: PRIVATE HEALTH INSURANCE | Source: Ambulatory Visit | Attending: Radiation Oncology

## 2023-04-16 ENCOUNTER — Institutional Professional Consult (permissible substitution): Payer: Self-pay | Admitting: Acute Care

## 2023-04-16 DIAGNOSIS — Z510 Encounter for antineoplastic radiation therapy: Secondary | ICD-10-CM | POA: Diagnosis not present

## 2023-04-16 LAB — RAD ONC ARIA SESSION SUMMARY
Course Elapsed Days: 0
Plan Fractions Treated to Date: 1
Plan Prescribed Dose Per Fraction: 2 Gy
Plan Total Fractions Prescribed: 35
Plan Total Prescribed Dose: 70 Gy
Reference Point Dosage Given to Date: 2 Gy
Reference Point Session Dosage Given: 2 Gy
Session Number: 1

## 2023-04-17 ENCOUNTER — Other Ambulatory Visit: Payer: Self-pay

## 2023-04-17 ENCOUNTER — Ambulatory Visit
Admission: RE | Admit: 2023-04-17 | Discharge: 2023-04-17 | Disposition: A | Discharge status: Home / Self Care | Payer: PRIVATE HEALTH INSURANCE | Source: Ambulatory Visit | Attending: Radiation Oncology | Admitting: Radiation Oncology

## 2023-04-17 ENCOUNTER — Encounter: Payer: Self-pay | Admitting: Emergency Medicine

## 2023-04-17 DIAGNOSIS — Z510 Encounter for antineoplastic radiation therapy: Secondary | ICD-10-CM | POA: Diagnosis not present

## 2023-04-17 DIAGNOSIS — C3411 Malignant neoplasm of upper lobe, right bronchus or lung: Secondary | ICD-10-CM

## 2023-04-17 LAB — RAD ONC ARIA SESSION SUMMARY
Course Elapsed Days: 1
Plan Fractions Treated to Date: 2
Plan Prescribed Dose Per Fraction: 2 Gy
Plan Total Fractions Prescribed: 35
Plan Total Prescribed Dose: 70 Gy
Reference Point Dosage Given to Date: 4 Gy
Reference Point Session Dosage Given: 2 Gy
Session Number: 2

## 2023-04-17 MED ORDER — SONAFINE EX EMUL
1.0000 | Freq: Once | CUTANEOUS | Status: AC
  Administered 2023-04-17: 1 via TOPICAL

## 2023-04-18 ENCOUNTER — Other Ambulatory Visit: Payer: Self-pay

## 2023-04-18 ENCOUNTER — Ambulatory Visit: Payer: PRIVATE HEALTH INSURANCE

## 2023-04-18 ENCOUNTER — Ambulatory Visit
Admission: RE | Admit: 2023-04-18 | Discharge: 2023-04-18 | Disposition: A | Discharge status: Home / Self Care | Payer: PRIVATE HEALTH INSURANCE | Source: Ambulatory Visit | Attending: Radiation Oncology | Admitting: Radiation Oncology

## 2023-04-18 DIAGNOSIS — Z510 Encounter for antineoplastic radiation therapy: Secondary | ICD-10-CM | POA: Diagnosis not present

## 2023-04-18 LAB — RAD ONC ARIA SESSION SUMMARY
Course Elapsed Days: 2
Plan Fractions Treated to Date: 3
Plan Prescribed Dose Per Fraction: 2 Gy
Plan Total Fractions Prescribed: 35
Plan Total Prescribed Dose: 70 Gy
Reference Point Dosage Given to Date: 6 Gy
Reference Point Session Dosage Given: 2 Gy
Session Number: 3

## 2023-04-21 ENCOUNTER — Inpatient Hospital Stay (HOSPITAL_BASED_OUTPATIENT_CLINIC_OR_DEPARTMENT_OTHER): Payer: PRIVATE HEALTH INSURANCE | Admitting: Physician Assistant

## 2023-04-21 ENCOUNTER — Ambulatory Visit
Admission: RE | Admit: 2023-04-21 | Discharge: 2023-04-21 | Disposition: A | Discharge status: Home / Self Care | Payer: PRIVATE HEALTH INSURANCE | Source: Ambulatory Visit | Attending: Radiation Oncology

## 2023-04-21 ENCOUNTER — Other Ambulatory Visit: Payer: Self-pay

## 2023-04-21 ENCOUNTER — Encounter (HOSPITAL_COMMUNITY): Payer: Self-pay | Admitting: Emergency Medicine

## 2023-04-21 ENCOUNTER — Inpatient Hospital Stay: Payer: PRIVATE HEALTH INSURANCE

## 2023-04-21 VITALS — BP 135/69 | HR 108 | Temp 98.1°F | Resp 16 | Wt 182.1 lb

## 2023-04-21 DIAGNOSIS — I871 Compression of vein: Secondary | ICD-10-CM

## 2023-04-21 DIAGNOSIS — Z510 Encounter for antineoplastic radiation therapy: Secondary | ICD-10-CM | POA: Diagnosis not present

## 2023-04-21 DIAGNOSIS — C3490 Malignant neoplasm of unspecified part of unspecified bronchus or lung: Secondary | ICD-10-CM

## 2023-04-21 DIAGNOSIS — R918 Other nonspecific abnormal finding of lung field: Secondary | ICD-10-CM | POA: Diagnosis not present

## 2023-04-21 LAB — CMP (CANCER CENTER ONLY)
ALT: 16 U/L (ref 0–44)
AST: 22 U/L (ref 15–41)
Albumin: 3.9 g/dL (ref 3.5–5.0)
Alkaline Phosphatase: 55 U/L (ref 38–126)
Anion gap: 8 (ref 5–15)
BUN: 21 mg/dL — ABNORMAL HIGH (ref 6–20)
CO2: 29 mmol/L (ref 22–32)
Calcium: 9.2 mg/dL (ref 8.9–10.3)
Chloride: 100 mmol/L (ref 98–111)
Creatinine: 0.93 mg/dL (ref 0.61–1.24)
GFR, Estimated: >60 mL/min (ref >60)
Glucose, Bld: 123 mg/dL — ABNORMAL HIGH (ref 70–99)
Potassium: 4.6 mmol/L (ref 3.5–5.1)
Sodium: 137 mmol/L (ref 135–145)
Total Bilirubin: 0.6 mg/dL (ref 0.0–1.2)
Total Protein: 6.7 g/dL (ref 6.5–8.1)

## 2023-04-21 LAB — CBC WITH DIFFERENTIAL (CANCER CENTER ONLY)
Abs Immature Granulocytes: 0.08 10*3/uL — ABNORMAL HIGH (ref 0.00–0.07)
Basophils Absolute: 0.1 10*3/uL (ref 0.0–0.1)
Basophils Relative: 2 %
Eosinophils Absolute: 0.1 10*3/uL (ref 0.0–0.5)
Eosinophils Relative: 2 %
HCT: 46.2 % (ref 39.0–52.0)
Hemoglobin: 15 g/dL (ref 13.0–17.0)
Immature Granulocytes: 2 %
Lymphocytes Relative: 12 %
Lymphs Abs: 0.7 10*3/uL (ref 0.7–4.0)
MCH: 30.2 pg (ref 26.0–34.0)
MCHC: 32.5 g/dL (ref 30.0–36.0)
MCV: 93.1 fL (ref 80.0–100.0)
Monocytes Absolute: 0.8 10*3/uL (ref 0.1–1.0)
Monocytes Relative: 15 %
Neutro Abs: 3.7 10*3/uL (ref 1.7–7.7)
Neutrophils Relative %: 67 %
Platelet Count: 230 10*3/uL (ref 150–400)
RBC: 4.96 MIL/uL (ref 4.22–5.81)
RDW: 14.4 % (ref 11.5–15.5)
WBC Count: 5.5 10*3/uL (ref 4.0–10.5)
nRBC: 0 % (ref 0.0–0.2)

## 2023-04-21 LAB — RAD ONC ARIA SESSION SUMMARY
Course Elapsed Days: 5
Plan Fractions Treated to Date: 4
Plan Prescribed Dose Per Fraction: 2 Gy
Plan Total Fractions Prescribed: 35
Plan Total Prescribed Dose: 70 Gy
Reference Point Dosage Given to Date: 8 Gy
Reference Point Session Dosage Given: 2 Gy
Session Number: 4

## 2023-04-21 MED ORDER — PREDNISONE 10 MG PO TABS: 10.0000 mg | ORAL_TABLET | Freq: Every day | ORAL | 0 refills | Status: DC

## 2023-04-21 NOTE — Progress Notes (Signed)
 SDW CALL  Patient was given pre-op instructions over the phone. The opportunity was given for the patient to ask questions. No further questions asked. Patient verbalized understanding of instructions given.   PCP - denies Cardiologist - denies  PPM/ICD - denies Device Orders -  Rep Notified -   Chest x-ray - CT-03/19/23 EKG - na Stress Test - denies ECHO - denies Cardiac Cath - denies  Sleep Study - denies CPAP -   Fasting Blood Sugar - na Checks Blood Sugar _____ times a day  Blood Thinner Instructions:na Aspirin Instructions:na  ERAS Protcol -no PRE-SURGERY Ensure or G2-   COVID TEST- na   Anesthesia review: no  Patient denies shortness of breath, fever, cough and chest pain over the phone call    Surgical Instructions    Your procedure is scheduled on April 1  Report to [facility] Main Entrance "A" at 1015 A.M., then check in with the Admitting office.  Call this number if you have problems the morning of surgery:  [phone]    Remember:  Do not eat or drink after midnight the night before your surgery   Take these medicines the morning of surgery with A SIP OF WATER: none   As of today, STOP taking any Aspirin (unless otherwise instructed by your surgeon) Aleve, Naproxen, Ibuprofen, Motrin, Advil, Goody's Body Pain (Aspirin-Acetaminophen), BC's, all herbal medications, fish oil, and all vitamins.  [facility] is not responsible for any belongings or valuables. .   Do NOT Smoke (Tobacco/Vaping)  24 hours prior to your procedure  If you use a CPAP at night, you may bring your mask for your overnight stay.   Contacts, glasses, hearing aids, dentures or partials may not be worn into surgery, please bring cases for these belongings   Patients discharged the day of surgery will not be allowed to drive home, and someone needs to stay with them for 24 hours.   She visitor guidelines for Inpatients (after your surgery is over and you are in a regular  room).     Special instructions:    Oral Hygiene is also important to reduce your risk of infection.  Remember - BRUSH YOUR TEETH THE MORNING OF SURGERY WITH YOUR REGULAR TOOTHPASTE   Day of Surgery:  Take a shower the day of or night before with antibacterial soap. Wear Clean/Comfortable clothing the morning of surgery Do not apply any deodorants/lotions.   Do not wear jewelry or makeup Do not wear lotions, powders, perfumes/colognes, or deodorant. Do not shave 48 hours prior to surgery.  Men may shave face and neck. Do not bring valuables to the hospital. Do not wear nail polish, gel polish, artificial nails, or any other type of covering on natural nails (fingers and toes) If you have artificial nails or gel coating that need to be removed by a nail salon, please have this removed prior to surgery. Artificial nails or gel coating may interfere with anesthesia's ability to adequately monitor your vital signs. Remember to brush your teeth WITH YOUR REGULAR TOOTHPASTE.

## 2023-04-22 ENCOUNTER — Ambulatory Visit: Payer: PRIVATE HEALTH INSURANCE

## 2023-04-22 ENCOUNTER — Encounter (HOSPITAL_COMMUNITY): Payer: Self-pay | Admitting: Emergency Medicine

## 2023-04-22 ENCOUNTER — Other Ambulatory Visit: Payer: Self-pay

## 2023-04-22 ENCOUNTER — Telehealth: Payer: Self-pay | Admitting: Internal Medicine

## 2023-04-22 ENCOUNTER — Encounter: Payer: Self-pay | Admitting: Emergency Medicine

## 2023-04-22 ENCOUNTER — Ambulatory Visit (HOSPITAL_COMMUNITY): Payer: Self-pay | Admitting: Certified Registered"

## 2023-04-22 ENCOUNTER — Ambulatory Visit (HOSPITAL_BASED_OUTPATIENT_CLINIC_OR_DEPARTMENT_OTHER): Payer: PRIVATE HEALTH INSURANCE | Admitting: Certified Registered"

## 2023-04-22 ENCOUNTER — Ambulatory Visit (HOSPITAL_COMMUNITY)
Admission: RE | Admit: 2023-04-22 | Discharge: 2023-04-22 | Disposition: A | Discharge status: Home / Self Care | Payer: PRIVATE HEALTH INSURANCE | Attending: Emergency Medicine | Admitting: Emergency Medicine

## 2023-04-22 ENCOUNTER — Encounter (HOSPITAL_COMMUNITY)
Admission: RE | Disposition: A | Discharge status: Home / Self Care | Payer: Self-pay | Source: Home / Self Care | Attending: Emergency Medicine

## 2023-04-22 DIAGNOSIS — R918 Other nonspecific abnormal finding of lung field: Secondary | ICD-10-CM

## 2023-04-22 DIAGNOSIS — F17210 Nicotine dependence, cigarettes, uncomplicated: Secondary | ICD-10-CM | POA: Diagnosis not present

## 2023-04-22 DIAGNOSIS — Z7952 Long term (current) use of systemic steroids: Secondary | ICD-10-CM | POA: Insufficient documentation

## 2023-04-22 DIAGNOSIS — R590 Localized enlarged lymph nodes: Secondary | ICD-10-CM | POA: Diagnosis not present

## 2023-04-22 DIAGNOSIS — J449 Chronic obstructive pulmonary disease, unspecified: Secondary | ICD-10-CM | POA: Diagnosis not present

## 2023-04-22 DIAGNOSIS — C3411 Malignant neoplasm of upper lobe, right bronchus or lung: Secondary | ICD-10-CM | POA: Insufficient documentation

## 2023-04-22 HISTORY — PX: BRONCHIAL BIOPSY: SHX5109

## 2023-04-22 HISTORY — PX: FLEXIBLE BRONCHOSCOPY: SHX5094

## 2023-04-22 HISTORY — PX: BRONCHIAL BRUSHINGS: SHX5108

## 2023-04-22 HISTORY — PX: HEMOSTASIS CLIP PLACEMENT: SHX6857

## 2023-04-22 SURGERY — BRONCHOSCOPY, FLEXIBLE
Anesthesia: General

## 2023-04-22 MED ORDER — CHLORHEXIDINE GLUCONATE 0.12 % MT SOLN: 15.0000 mL | Freq: Once | OROMUCOSAL | Status: AC

## 2023-04-22 MED ORDER — PROPOFOL 10 MG/ML IV BOLUS
INTRAVENOUS | Status: DC | PRN
  Administered 2023-04-22: 150 mg via INTRAVENOUS

## 2023-04-22 MED ORDER — ONDANSETRON HCL 4 MG/2ML IJ SOLN
INTRAMUSCULAR | Status: DC | PRN
  Administered 2023-04-22: 4 mg via INTRAVENOUS

## 2023-04-22 MED ORDER — ESMOLOL HCL 100 MG/10ML IV SOLN
INTRAVENOUS | Status: DC | PRN
  Administered 2023-04-22: 20 mg via INTRAVENOUS

## 2023-04-22 MED ORDER — CHLORHEXIDINE GLUCONATE 0.12 % MT SOLN
OROMUCOSAL | Status: AC
  Administered 2023-04-22: 15 mL via OROMUCOSAL
  Filled 2023-04-22: qty 15

## 2023-04-22 MED ORDER — DEXAMETHASONE SODIUM PHOSPHATE 10 MG/ML IJ SOLN
INTRAMUSCULAR | Status: DC | PRN
  Administered 2023-04-22: 5 mg via INTRAVENOUS

## 2023-04-22 MED ORDER — SUCCINYLCHOLINE CHLORIDE 200 MG/10ML IV SOSY
PREFILLED_SYRINGE | INTRAVENOUS | Status: DC | PRN
  Administered 2023-04-22: 120 mg via INTRAVENOUS

## 2023-04-22 MED ORDER — LIDOCAINE 2% (20 MG/ML) 5 ML SYRINGE
INTRAMUSCULAR | Status: DC | PRN
  Administered 2023-04-22: 80 mg via INTRAVENOUS

## 2023-04-22 MED ORDER — FENTANYL CITRATE (PF) 100 MCG/2ML IJ SOLN
INTRAMUSCULAR | Status: AC
  Filled 2023-04-22: qty 2

## 2023-04-22 MED ORDER — FENTANYL CITRATE (PF) 250 MCG/5ML IJ SOLN
INTRAMUSCULAR | Status: DC | PRN
  Administered 2023-04-22: 100 ug via INTRAVENOUS

## 2023-04-22 MED ORDER — SODIUM CHLORIDE (PF) 0.9 % IJ SOLN
PREFILLED_SYRINGE | INTRAMUSCULAR | Status: DC | PRN
  Administered 2023-04-22 (×3): 4 mL

## 2023-04-22 MED ORDER — ROCURONIUM BROMIDE 10 MG/ML (PF) SYRINGE
PREFILLED_SYRINGE | INTRAVENOUS | Status: DC | PRN
  Administered 2023-04-22: 30 mg via INTRAVENOUS

## 2023-04-22 MED ORDER — PHENYLEPHRINE 80 MCG/ML (10ML) SYRINGE FOR IV PUSH (FOR BLOOD PRESSURE SUPPORT)
PREFILLED_SYRINGE | INTRAVENOUS | Status: AC
  Filled 2023-04-22: qty 30

## 2023-04-22 MED ORDER — PROPOFOL 500 MG/50ML IV EMUL
INTRAVENOUS | Status: DC | PRN
  Administered 2023-04-22: 150 ug/kg/min via INTRAVENOUS

## 2023-04-22 MED ORDER — PHENYLEPHRINE HCL-NACL 20-0.9 MG/250ML-% IV SOLN
INTRAVENOUS | Status: DC | PRN
  Administered 2023-04-22: 20 ug/min via INTRAVENOUS

## 2023-04-22 MED ORDER — SUGAMMADEX SODIUM 200 MG/2ML IV SOLN
INTRAVENOUS | Status: DC | PRN
  Administered 2023-04-22: 200 mg via INTRAVENOUS

## 2023-04-22 MED ORDER — PHENYLEPHRINE 80 MCG/ML (10ML) SYRINGE FOR IV PUSH (FOR BLOOD PRESSURE SUPPORT): PREFILLED_SYRINGE | INTRAVENOUS | Status: DC | PRN

## 2023-04-22 MED ORDER — LACTATED RINGERS IV SOLN
INTRAVENOUS | Status: DC
Start: 2023-04-22 — End: 2023-04-22

## 2023-04-22 NOTE — Progress Notes (Signed)
 Met with the pt on 3/31 at his follow up appt with C.Heilingoetter, Onc PA. Pt states that his appetite and energy levels have gone down since he stopped the steroid.  Pt states he will be getting his biopsy done on 4/1 so a follow up with C.Heilingoetter, Onc PA or Dr Arbutus Ped is needed for the following week. Pt continues to state that he can't miss work because he has to provide for his family. I asked the pt if he was interested in speaking to social work again to who may be able to provide some resources that may help him financially, but pt declined and says "I'm not just going to sit around and not work. Bills won't stop"  Pt had to go to radiation after the conclusion of his appt with C.Heilingoetter, Onc PA. I let him know a scheduler will call him to make his follow up next week

## 2023-04-22 NOTE — Op Note (Signed)
 Video Bronchoscopy Procedure Note  Date of Operation: 04/22/2023  Pre-op Diagnosis: Right upper lobe mass  Post-op Diagnosis: Same  Surgeon: Levy Pupa  Assistants: none  Anesthesia: General anesthesia  Operation: Flexible video fiberoptic bronchoscopy and biopsies.  Estimated Blood Loss: 15-20 cc  Complications: none noted  Indications and History: Eric Foley is 53 y.o. with history of tobacco use.  Found to have a large right upper lobe mass with associated SVC syndrome..  Recommendation was to perform video fiberoptic bronchoscopy with biopsies. The risks, benefits, complications, treatment options and expected outcomes were discussed with the patient.  The possibilities of pneumothorax, pneumonia, reaction to medication, pulmonary aspiration, perforation of a viscus, bleeding, failure to diagnose a condition and creating a complication requiring transfusion or operation were discussed with the patient who freely signed the consent.    Description of Procedure: The patient was seen in the Preoperative Area, was examined and was deemed appropriate to proceed.  The patient was taken to [facility] endoscopy room 3, identified as Marnee Guarneri and the procedure verified as Flexible Video Fiberoptic Bronchoscopy.  A Time Out was held and the above information confirmed.   General anesthesia was initiated and the patient was endotracheally intubated.  The video fiberoptic bronchoscope was introduced via the ET tube and a general inspection was performed which showed normal distal trachea, normal main carina. The R sided airways were inspected and showed a lobulated vascular mass emanating from the apical segment of the right upper lobe.  The anterior and posterior segments of the right upper lobe were spared.  The bronchus intermedius, RML and RLL airways all appeared normal. The L side was then inspected. The LLL, Lingular and LUL airways were normal.   1:10000 epi was injected onto  the lesion, 6 cc total in divided doses.  Right upper lobe apical segment endobronchial brushings and endobronchial forceps biopsies were performed. There was some initial mild bleeding that stopped quickly. The patient tolerated the procedure well. The bronchoscope was removed. There were no obvious complications.   Samples: 1. Endobronchial brushings from right upper lobe mass 2. Endobronchial forceps biopsies from right upper lobe mass  Plans:  We will review the cytology, pathology results with the patient when they become available.  Outpatient followup will be with Dr. Delton Coombes, Dr. Kathrynn Running, Dr. Arbutus Ped.    Levy Pupa, MD, PhD 04/22/2023, 1:15 PM [facility] [phone] or if no answer [phone]

## 2023-04-22 NOTE — Anesthesia Postprocedure Evaluation (Signed)
 Anesthesia Post Note  Patient: Eric Foley  Procedure(s) Performed: BRONCHOSCOPY, FLEXIBLE BRONCHOSCOPY, WITH BIOPSY BRONCHOSCOPY, WITH BRUSH BIOPSY CONTROL OF HEMORRHAGE, lung     Patient location during evaluation: PACU Anesthesia Type: General Level of consciousness: awake and alert Pain management: pain level controlled Vital Signs Assessment: post-procedure vital signs reviewed and stable Respiratory status: spontaneous breathing, nonlabored ventilation and respiratory function stable Cardiovascular status: blood pressure returned to baseline and stable Postop Assessment: no apparent nausea or vomiting Anesthetic complications: no   There were no known notable events for this encounter.  Last Vitals:  Vitals:   04/22/23 1406 04/22/23 1410  BP:  116/76  Pulse: (!) 109 (!) 110  Resp: (!) 22 19  Temp:    SpO2: 92% 91%    Last Pain:  Vitals:   04/22/23 1410  TempSrc:   PainSc: 0-No pain                 Lowella Curb

## 2023-04-22 NOTE — Telephone Encounter (Signed)
 Scheduled appointments per 3/31 LOS notes. The patient is aware of the appointments scheduled.

## 2023-04-22 NOTE — Anesthesia Preprocedure Evaluation (Addendum)
 Anesthesia Evaluation  Patient identified by MRN, date of birth, ID band Patient awake    Reviewed: Allergy & Precautions, H&P , NPO status , Patient's Chart, lab work & pertinent test results  Airway Mallampati: II  TM Distance: >3 FB Neck ROM: Full    Dental  (+) Missing, Chipped, Poor Dentition   Pulmonary COPD, Current Smoker and Patient abstained from smoking.   Pulmonary exam normal breath sounds clear to auscultation       Cardiovascular negative cardio ROS Normal cardiovascular exam Rhythm:Regular Rate:Normal     Neuro/Psych negative neurological ROS  negative psych ROS   GI/Hepatic negative GI ROS, Neg liver ROS,,,  Endo/Other  negative endocrine ROS    Renal/GU negative Renal ROS  negative genitourinary   Musculoskeletal negative musculoskeletal ROS (+)    Abdominal   Peds negative pediatric ROS (+)  Hematology negative hematology ROS (+)   Anesthesia Other Findings Cancer  Reproductive/Obstetrics negative OB ROS                             Anesthesia Physical Anesthesia Plan  ASA: 4  Anesthesia Plan: General   Post-op Pain Management: Minimal or no pain anticipated   Induction: Intravenous  PONV Risk Score and Plan: 1 and Ondansetron and Treatment may vary due to age or medical condition  Airway Management Planned: Oral ETT  Additional Equipment:   Intra-op Plan:   Post-operative Plan: Extubation in OR  Informed Consent: I have reviewed the patients History and Physical, chart, labs and discussed the procedure including the risks, benefits and alternatives for the proposed anesthesia with the patient or authorized representative who has indicated his/her understanding and acceptance.     Dental advisory given  Plan Discussed with: CRNA  Anesthesia Plan Comments:        Anesthesia Quick Evaluation

## 2023-04-22 NOTE — Discharge Instructions (Signed)
 Flexible Bronchoscopy, Care After This sheet gives you information about how to care for yourself after your test. Your doctor may also give you more specific instructions. If you have problems or questions, contact your doctor. Follow these instructions at home: Eating and drinking When your numbness is gone and your cough and gag reflexes have come back, you may: Eat only soft foods. Slowly drink liquids. When you get home after the test, go back to your normal diet. Driving Do not drive for 24 hours if you were given a medicine to help you relax (sedative). Do not drive or use heavy machinery while taking prescription pain medicine.  Work You may return to work on 04/24/2023  General instructions  Take over-the-counter and prescription medicines only as told by your doctor. Return to your normal activities as told. Ask what activities are safe for you. Do not use any products that have nicotine or tobacco in them. This includes cigarettes and e-cigarettes. If you need help quitting, ask your doctor. Keep all follow-up visits as told by your doctor. This is important. It is very important if you had a tissue sample (biopsy) taken. Get help right away if: You have shortness of breath that gets worse. You get light-headed. You feel like you are going to pass out (faint). You have chest pain. You cough up: More than a little blood. More blood than before. Summary Do not eat or drink anything (not even water) for 2 hours after your test, or until your numbing medicine wears off. Do not use cigarettes. Do not use e-cigarettes. Get help right away if you have chest pain.  Please call our office for any questions or concerns.  [phone].  This information is not intended to replace advice given to you by your health care provider. Make sure you discuss any questions you have with your health care provider. Document Released: 11/04/2008 Document Revised: 12/20/2016 Document Reviewed:  01/26/2016 Elsevier Patient Education  2020 [employer].

## 2023-04-22 NOTE — Transfer of Care (Signed)
 Immediate Anesthesia Transfer of Care Note  Patient: Marnee Guarneri  Procedure(s) Performed: BRONCHOSCOPY, FLEXIBLE BRONCHOSCOPY, WITH BIOPSY BRONCHOSCOPY, WITH BRUSH BIOPSY CONTROL OF HEMORRHAGE, lung  Patient Location: PACU  Anesthesia Type:General  Level of Consciousness: awake, alert , oriented, and patient cooperative  Airway & Oxygen Therapy: Patient Spontanous Breathing and Patient connected to face mask oxygen  Post-op Assessment: Report given to RN, Post -op Vital signs reviewed and stable, and Patient moving all extremities X 4  Post vital signs: Reviewed and stable  Last Vitals:  Vitals Value Taken Time  BP 134/82 04/22/23 1330  Temp 37.1   Pulse 103 04/22/23 1332  Resp 18 04/22/23 1332  SpO2 95 % 04/22/23 1332  Vitals shown include unfiled device data.  Last Pain:  Vitals:   04/22/23 1030  TempSrc:   PainSc: 0-No pain         Complications: No notable events documented.

## 2023-04-22 NOTE — H&P (Signed)
 Eric Foley is an 53 y.o. male.   Chief Complaint: Right lung mass HPI: 53 year old male with history of tobacco use, COPD.  He was evaluated for dyspnea, cough, hemoptysis, facial and right arm swelling.  That you have a large right upper lobe mass, SVC syndrome.  He has already begun urgent XRT.  Evaluation consistent with stage IIIc lung cancer.  He presents now for bronchoscopy to obtain a tissue diagnosis. He states that he has been weak, having difficulty going to work.  He has cough with some hemoptysis.  He was started on prednisone a few days ago for the symptoms.  Past Medical History:  Diagnosis Date   COPD (chronic obstructive pulmonary disease) (HCC)    History of kidney stones     Past Surgical History:  Procedure Laterality Date   TONSILLECTOMY      History reviewed. No pertinent family history. Social History:  reports that he has been smoking cigarettes. He has never used smokeless tobacco. He reports that he does not drink alcohol and does not use drugs.  Allergies:  Allergies  Allergen Reactions   Penicillins Anaphylaxis   Amoxicillin Rash   Ampicillin-Sulbactam Sodium Rash    Medications Prior to Admission  Medication Sig Dispense Refill   Aspirin-Acetaminophen (GOODYS BODY PAIN PO) Take by mouth 2 (two) times daily.     Potassium 99 MG TABS Take 99 mg by mouth 2 (two) times daily.     predniSONE (DELTASONE) 10 MG tablet Take 1 tablet (10 mg total) by mouth daily with breakfast. 10 tablet 0    Results for orders placed or performed in visit on 04/21/23 (from the past 48 hours)  CMP (Cancer Center only)     Status: Abnormal   Collection Time: 04/21/23  3:11 PM  Result Value Ref Range   Sodium 137 135 - 145 mmol/L   Potassium 4.6 3.5 - 5.1 mmol/L   Chloride 100 98 - 111 mmol/L   CO2 29 22 - 32 mmol/L   Glucose, Bld 123 (H) 70 - 99 mg/dL    Comment: Glucose reference range applies only to samples taken after fasting for at least 8 hours.   BUN 21 (H) 6  - 20 mg/dL   Creatinine 1.61 0.96 - 1.24 mg/dL   Calcium 9.2 8.9 - 04.5 mg/dL   Total Protein 6.7 6.5 - 8.1 g/dL   Albumin 3.9 3.5 - 5.0 g/dL   AST 22 15 - 41 U/L   ALT 16 0 - 44 U/L   Alkaline Phosphatase 55 38 - 126 U/L   Total Bilirubin 0.6 0.0 - 1.2 mg/dL   GFR, Estimated >40 >98 mL/min    Comment: (NOTE) Calculated using the CKD-EPI Creatinine Equation (2021)    Anion gap 8 5 - 15    Comment: Performed at [facility], [address] [place], [place] [address]  CBC with Differential (Cancer Center Only)     Status: Abnormal   Collection Time: 04/21/23  3:11 PM  Result Value Ref Range   WBC Count 5.5 4.0 - 10.5 K/uL   RBC 4.96 4.22 - 5.81 MIL/uL   Hemoglobin 15.0 13.0 - 17.0 g/dL   HCT 78.2 95.6 - 21.3 %   MCV 93.1 80.0 - 100.0 fL   MCH 30.2 26.0 - 34.0 pg   MCHC 32.5 30.0 - 36.0 g/dL   RDW 08.6 57.8 - 46.9 %   Platelet Count 230 150 - 400 K/uL   nRBC 0.0 0.0 -  0.2 %   Neutrophils Relative % 67 %   Neutro Abs 3.7 1.7 - 7.7 K/uL   Lymphocytes Relative 12 %   Lymphs Abs 0.7 0.7 - 4.0 K/uL   Monocytes Relative 15 %   Monocytes Absolute 0.8 0.1 - 1.0 K/uL   Eosinophils Relative 2 %   Eosinophils Absolute 0.1 0.0 - 0.5 K/uL   Basophils Relative 2 %   Basophils Absolute 0.1 0.0 - 0.1 K/uL   Immature Granulocytes 2 %   Abs Immature Granulocytes 0.08 (H) 0.00 - 0.07 K/uL    Comment: Performed at [facility], [address] [place], [place] [address]   No results found.  Review of Systems As per HPI Blood pressure 120/66, pulse (!) 101, temperature 97.9 F (36.6 C), temperature source Oral, resp. rate 18, height 6' (1.829 m), weight 83 kg, SpO2 97%. Physical Exam  Gen: Pleasant, in no distress,  normal affect  ENT: No lesions,  mouth clear,  oropharynx clear, no postnasal drip.  Some cough.  No secretions  Neck: No JVD, no stridor  Lungs: No use of accessory muscles, bronchial breath sounds on the right, clear on  the left, distant.  No wheezing  Cardiovascular: RRR, heart sounds normal, no murmur or gallops, no peripheral edema  Abdomen: soft and NT, no HSM,  BS normal  Musculoskeletal: No deformities, no cyanosis or clubbing  Neuro: alert, awake, non focal  Skin: Warm, no lesions or rashes    Assessment/Plan Right upper lobe mass, likely primary lung cancer.  He presents now for tissue diagnosis.  Planning for bronchoscopy with endobronchial ultrasound.  Patient understands the risks, benefits, rationale of the procedure and he agrees to proceed.  No barriers identified.  Leslye Peer, MD 04/22/2023, 12:35 PM

## 2023-04-22 NOTE — Anesthesia Procedure Notes (Signed)
 Procedure Name: Intubation Date/Time: 04/22/2023 12:50 PM  Performed by: Alwyn Ren, CRNAPre-anesthesia Checklist: Patient identified, Emergency Drugs available, Suction available and Patient being monitored Patient Re-evaluated:Patient Re-evaluated prior to induction Oxygen Delivery Method: Circle system utilized Preoxygenation: Pre-oxygenation with 100% oxygen Induction Type: IV induction Ventilation: Mask ventilation without difficulty Laryngoscope Size: Miller and 2 Grade View: Grade I Tube type: Oral Tube size: 8.5 mm Number of attempts: 1 Airway Equipment and Method: Stylet and Oral airway Placement Confirmation: ETT inserted through vocal cords under direct vision, positive ETCO2 and breath sounds checked- equal and bilateral Secured at: 22 cm Tube secured with: Tape Dental Injury: Teeth and Oropharynx as per pre-operative assessment  Comments: Airway by Sherron Flemings

## 2023-04-23 ENCOUNTER — Ambulatory Visit: Admitting: Acute Care

## 2023-04-23 ENCOUNTER — Ambulatory Visit: Payer: PRIVATE HEALTH INSURANCE

## 2023-04-23 NOTE — Progress Notes (Signed)
 I reached out to the pt to see if he would be able to come in for an appt sooner than 4/21 to go over his biopsy results from his biopsy on 4/1. Pt states he would be willing to take a day off from work to be able to have an earlier appt. I let him know there were appts available on 4/9 and 4/10. Pt requested an appt on 4/10. [employer], scheduler, notified via [place]. Scheduler confirmed receipt of request.

## 2023-04-24 ENCOUNTER — Other Ambulatory Visit: Payer: Self-pay | Admitting: Radiation Oncology

## 2023-04-24 ENCOUNTER — Ambulatory Visit: Admitting: Emergency Medicine

## 2023-04-24 ENCOUNTER — Other Ambulatory Visit: Payer: Self-pay

## 2023-04-24 ENCOUNTER — Ambulatory Visit: Payer: PRIVATE HEALTH INSURANCE

## 2023-04-24 ENCOUNTER — Ambulatory Visit
Admission: RE | Admit: 2023-04-24 | Discharge: 2023-04-24 | Discharge status: Home / Self Care | Payer: PRIVATE HEALTH INSURANCE | Source: Ambulatory Visit | Attending: Radiation Oncology

## 2023-04-24 DIAGNOSIS — M79602 Pain in left arm: Secondary | ICD-10-CM | POA: Diagnosis not present

## 2023-04-24 DIAGNOSIS — R630 Anorexia: Secondary | ICD-10-CM | POA: Diagnosis not present

## 2023-04-24 DIAGNOSIS — F17210 Nicotine dependence, cigarettes, uncomplicated: Secondary | ICD-10-CM | POA: Diagnosis not present

## 2023-04-24 DIAGNOSIS — Z452 Encounter for adjustment and management of vascular access device: Secondary | ICD-10-CM | POA: Diagnosis not present

## 2023-04-24 DIAGNOSIS — D701 Agranulocytosis secondary to cancer chemotherapy: Secondary | ICD-10-CM | POA: Diagnosis not present

## 2023-04-24 DIAGNOSIS — C3411 Malignant neoplasm of upper lobe, right bronchus or lung: Secondary | ICD-10-CM | POA: Insufficient documentation

## 2023-04-24 DIAGNOSIS — Z5111 Encounter for antineoplastic chemotherapy: Secondary | ICD-10-CM | POA: Diagnosis present

## 2023-04-24 DIAGNOSIS — I871 Compression of vein: Secondary | ICD-10-CM | POA: Insufficient documentation

## 2023-04-24 DIAGNOSIS — M7989 Other specified soft tissue disorders: Secondary | ICD-10-CM | POA: Diagnosis not present

## 2023-04-24 LAB — RAD ONC ARIA SESSION SUMMARY
Course Elapsed Days: 8
Plan Fractions Treated to Date: 5
Plan Prescribed Dose Per Fraction: 2 Gy
Plan Total Fractions Prescribed: 35
Plan Total Prescribed Dose: 70 Gy
Reference Point Dosage Given to Date: 10 Gy
Reference Point Session Dosage Given: 2 Gy
Session Number: 5

## 2023-04-24 LAB — CYTOLOGY - NON PAP

## 2023-04-24 MED ORDER — FAMCICLOVIR 500 MG PO TABS: 500.0000 mg | ORAL_TABLET | Freq: Three times a day (TID) | ORAL | 0 refills | Status: AC

## 2023-04-25 ENCOUNTER — Ambulatory Visit: Payer: PRIVATE HEALTH INSURANCE

## 2023-04-25 NOTE — Progress Notes (Signed)
 I received a secure email from [facility] medicine requesting a signature for this pt's Liquid CDx request. I printed the form and faxed to [facility] at [phone]. I requested they fax the signed form back to me, or fax to [employer] [facility] [place] at [phone].

## 2023-04-28 ENCOUNTER — Other Ambulatory Visit: Payer: Self-pay

## 2023-04-28 ENCOUNTER — Ambulatory Visit
Admission: RE | Admit: 2023-04-28 | Discharge: 2023-04-28 | Disposition: A | Discharge status: Home / Self Care | Payer: PRIVATE HEALTH INSURANCE | Source: Ambulatory Visit | Attending: Radiation Oncology | Admitting: Radiation Oncology

## 2023-04-28 ENCOUNTER — Ambulatory Visit: Payer: PRIVATE HEALTH INSURANCE

## 2023-04-28 DIAGNOSIS — Z5111 Encounter for antineoplastic chemotherapy: Secondary | ICD-10-CM | POA: Diagnosis not present

## 2023-04-28 LAB — RAD ONC ARIA SESSION SUMMARY
Course Elapsed Days: 12
Plan Fractions Treated to Date: 6
Plan Prescribed Dose Per Fraction: 2 Gy
Plan Total Fractions Prescribed: 35
Plan Total Prescribed Dose: 70 Gy
Reference Point Dosage Given to Date: 12 Gy
Reference Point Session Dosage Given: 2 Gy
Session Number: 6

## 2023-04-29 ENCOUNTER — Ambulatory Visit: Payer: PRIVATE HEALTH INSURANCE

## 2023-04-29 NOTE — Progress Notes (Unsigned)
 [facility] OFFICE PROGRESS NOTE  Patient, No Pcp Per No address on file  DIAGNOSIS: Extensive stage Small cell lung cancer.  He presented with a large right upper lobe lung mass with mediastinal invasion, right pleural/subpleural involvement, left supraclavicular adenopathy, and possible pelvic peritoneal nodule.  PRIOR THERAPY: None  CURRENT THERAPY:  1) radiation to the large right lung mass under the care of Dr. Kathrynn Running last dose on 06/06/2023  INTERVAL HISTORY: Eric Foley 53 y.o. male returns to the clinic today for a follow-up visit.  The patient was last seen in clinic on 04/21/2023.  That time the patient still required a biopsy. The patient also is still trying to work and is very adamant on working.  He is wondering if he could get a work note that is okay to drive locally.    It is being seen he did undergo his biopsy on 04/22/2023.  Unfortunately, the final pathology showed small cell lung cancer.  He also had shingles in the interval and he is being treated.  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  The patient has noticed significant improvement in his upper extremity swelling.  The patient is also on low-dose steroids due to it helping with weakness, pain, and cramping in his legs.  Patient's energy and breathing has also improved.  He denies any fever or chills.  He denies any more hemoptysis.  He denies any nausea, vomiting, diarrhea, or constipation.  He denies any headache or visual changes.  He is here today for evaluation and for more detailed discussion about his current condition and treatment options.  MEDICAL HISTORY: Past Medical History:  Diagnosis Date   COPD (chronic obstructive pulmonary disease) (HCC)    History of kidney stones     ALLERGIES:  is allergic to penicillins, amoxicillin, and ampicillin-sulbactam sodium.  MEDICATIONS:  Current Outpatient Medications  Medication Sig  Dispense Refill   Aspirin-Acetaminophen (GOODYS BODY PAIN PO) Take by mouth 2 (two) times daily.     famciclovir (FAMVIR) 500 MG tablet Take 1 tablet (500 mg total) by mouth 3 (three) times daily for 10 days. 30 tablet 0   Potassium 99 MG TABS Take 99 mg by mouth 2 (two) times daily.     predniSONE (DELTASONE) 10 MG tablet Take 1 tablet (10 mg total) by mouth daily with breakfast. 10 tablet 0   No current facility-administered medications for this visit.    SURGICAL HISTORY:  Past Surgical History:  Procedure Laterality Date   BRONCHIAL BIOPSY  04/22/2023   Procedure: BRONCHOSCOPY, WITH BIOPSY;  Surgeon: Leslye Peer, MD;  Location: [facility] ENDOSCOPY;  Service: Pulmonary;;   BRONCHIAL BRUSHINGS  04/22/2023   Procedure: BRONCHOSCOPY, WITH BRUSH BIOPSY;  Surgeon: Leslye Peer, MD;  Location: [facility] ENDOSCOPY;  Service: Pulmonary;;   FLEXIBLE BRONCHOSCOPY  04/22/2023   Procedure: Dionicio Stall;  Surgeon: Leslye Peer, MD;  Location: [facility] ENDOSCOPY;  Service: Pulmonary;;   HEMOSTASIS CLIP PLACEMENT  04/22/2023   Procedure: CONTROL OF HEMORRHAGE, lung;  Surgeon: Leslye Peer, MD;  Location: [facility] ENDOSCOPY;  Service: Pulmonary;;   TONSILLECTOMY      REVIEW OF SYSTEMS:   Constitutional: Positive for fatigue (improved). Negative for chills, fever and unexpected weight change.  HENT: Negative for mouth sores, nosebleeds, sore throat and trouble swallowing.   Eyes: Negative for eye problems and icterus.  Respiratory:Positive for improved shortness of breath and cough.  Negative for  hemoptysis and  wheezing.   Cardiovascular: Negative for chest pain and leg swelling.  Gastrointestinal: Negative for abdominal pain, constipation, diarrhea, nausea and vomiting.  Genitourinary: Negative for bladder incontinence, difficulty urinating, dysuria, frequency and hematuria.   Musculoskeletal:Negative for back pain, gait problem, neck pain and neck stiffness.  Skin: Negative for itching and rash.   Neurological: Positive for generalized weakness in the lower extremities.  Negative for dizziness, extremity weakness, gait problem, headaches, light-headedness and seizures.  Hematological: Negative for adenopathy. Does not bruise/bleed easily.  Psychiatric/Behavioral: Negative for confusion, depression and sleep disturbance. The patient is not nervous/anxious.       PHYSICAL EXAMINATION:  There were no vitals taken for this visit.  ECOG PERFORMANCE STATUS: 1  Physical Exam  Constitutional: Oriented to person, place, and time and well-developed, well-nourished, and in no distress.  HENT:  Head: Normocephalic and atraumatic.  Mouth/Throat: Oropharynx is clear and moist. No oropharyngeal exudate.  Eyes: Conjunctivae are normal. Right eye exhibits no discharge. Left eye exhibits no discharge. No scleral icterus.  Neck: Normal range of motion. Neck supple.  Cardiovascular: Normal rate, regular rhythm, normal heart sounds and intact distal pulses.   Pulmonary/Chest: Effort normal.  Quiet breath sounds bilaterally. no respiratory distress. No wheezes. No rales.  Abdominal: Soft. Bowel sounds are normal. Exhibits no distension and no mass. There is no tenderness.  Musculoskeletal: Normal range of motion. Exhibits no edema.  Lymphadenopathy:    No cervical adenopathy. Positive for some distension in his neck blood vessels.  Neurological: Alert and oriented to person, place, and time. Exhibits normal muscle tone. Gait normal. Coordination normal.  Skin: Skin is warm and dry. No rash noted. Not diaphoretic. No erythema. No pallor.  Psychiatric: Mood, memory and judgment normal.  Vitals reviewed.    LABORATORY DATA: Lab Results  Component Value Date   WBC 5.5 04/21/2023   HGB 15.0 04/21/2023   HCT 46.2 04/21/2023   MCV 93.1 04/21/2023   PLT 230 04/21/2023      Chemistry      Component Value Date/Time   NA 137 04/21/2023 1511   K 4.6 04/21/2023 1511   CL 100 04/21/2023 1511   CO2  29 04/21/2023 1511   BUN 21 (H) 04/21/2023 1511   CREATININE 0.93 04/21/2023 1511      Component Value Date/Time   CALCIUM 9.2 04/21/2023 1511   ALKPHOS 55 04/21/2023 1511   AST 22 04/21/2023 1511   ALT 16 04/21/2023 1511   BILITOT 0.6 04/21/2023 1511       RADIOGRAPHIC STUDIES:  MR BRAIN W WO CONTRAST Result Date: 04/09/2023 CLINICAL DATA:  Provided history: Malignant neoplasm of unspecified part of unspecified bronchus or lung. Non-small cell lung cancer, staging. EXAM: MRI HEAD WITHOUT AND WITH CONTRAST TECHNIQUE: Multiplanar, multiecho pulse sequences of the brain and surrounding structures were obtained without and with intravenous contrast. CONTRAST:  8mL GADAVIST GADOBUTROL 1 MMOL/ML IV SOLN COMPARISON:  PET CT 04/07/2023. FINDINGS: Brain: No age-advanced or lobar predominant cerebral atrophy. Mild multifocal T2 FLAIR hyperintense signal abnormality within the cerebral white matter, nonspecific but most often secondary to chronic small vessel ischemia. Chronic lacunar infarct versus prominent perivascular space within the right basal ganglia inferiorly. No cortical encephalomalacia is identified. There is no acute infarct. No evidence of an intracranial mass. No chronic intracranial blood products. No extra-axial fluid collection. No midline shift. No pathologic intracranial enhancement identified. Vascular: Maintained flow voids within the proximal large arterial vessels. Skull and upper cervical spine: No focal worrisome marrow lesion. Sinuses/Orbits:  No mass or acute finding within the imaged orbits. No significant paranasal sinus disease. Other: Trace fluid within left mastoid air cells. Ovoid 2.4 cm cystic-appearing cutaneous/subcutaneous right facial lesion with associated restricted diffusion. This is nonspecific, but may reflect an epidermal inclusion cyst. IMPRESSION: 1. No evidence of intracranial metastatic disease. 2. Chronic lacunar infarct versus prominent perivascular space  within the right basal ganglia inferiorly. 3. Mild T2 FLAIR hyperintense signal changes within the cerebral white matter, nonspecific but most often secondary to chronic small vessel ischemia. 4. 2.4 cm cystic-appearing cutaneous/subcutaneous right facial, nonspecific but possibly reflecting an epidermal inclusion cyst. Direct examination recommended. Electronically Signed   By: Jackey Loge D.O.   On: 04/09/2023 14:51   NM PET Image Initial (PI) Skull Base To Thigh (F-18 FDG) Result Date: 04/09/2023 CLINICAL DATA:  Initial treatment strategy for lung nodule. EXAM: NUCLEAR MEDICINE PET SKULL BASE TO THIGH TECHNIQUE: 9.1 mCi F-18 FDG was injected intravenously. Full-ring PET imaging was performed from the skull base to thigh after the radiotracer. CT data was obtained and used for attenuation correction and anatomic localization. Fasting blood glucose: 122 mg/dl COMPARISON:  CT chest 78/29/5621 and 12/16/2016. FINDINGS: Mediastinal blood pool activity: SUV max 1.8 Liver activity: SUV max NA NECK: No abnormal hypermetabolism. Incidental CT findings: None. CHEST: Hypermetabolic right upper lobe mass invades the mediastinum, crosses the midline and measures 10.0 x 13.3 cm, SUV 52.4. Hypermetabolic lymph nodes extend to the low left internal jugular station, measuring 8 mm (4/55), SUV max 2.9. Subpleural nodule in the medial right lower lobe measures 8 mm (7/43), SUV max 11.3. Additional subpleural/pleural nodules in the posterior base of the right hemithorax, SUV max 19.0. Focal hypermetabolism associated with the distal esophagus, SUV max 6.7, possibly with a luminal nodule on CT chest 03/19/2023. Incidental CT findings: Atherosclerotic calcification of the aorta with age advanced involvement of the left anterior descending coronary artery. Heart size normal. No pericardial effusion. No pleural effusion. Centrilobular and paraseptal emphysema. ABDOMEN/PELVIS: Hypermetabolic 6 mm ventral pelvic peritoneal nodule  (4/195), SUV max 16.1. No additional abnormal hypermetabolism. Incidental CT findings: Low-attenuation lesions in the left kidney. No specific follow-up necessary. Small bilateral inguinal hernias contain fat. SKELETON: No abnormal hypermetabolism. Incidental CT findings: Degenerative changes in the spine. IMPRESSION: 1. Large right upper lobe mass with mediastinal invasion, right pleural/subpleural involvement and left supraclavicular adenopath, findings compatible with at least T4 N3 M0 or stage IIIC disease. However, the hypermetabolic pelvic peritoneal nodule is worrisome for stage IV disease. 2. Focal hypermetabolism associated with the distal esophagus, worrisome for a distal esophageal lesion. Consider esophagram or upper endoscopy in further evaluation, as clinically indicated. 3. Age advanced left anterior descending coronary artery calcification. 4.  Aortic atherosclerosis (ICD10-I70.0). 5.  Emphysema (ICD10-J43.9). Electronically Signed   By: Leanna Battles M.D.   On: 04/09/2023 14:23     ASSESSMENT/PLAN:  This is a very pleasant 53 year old Caucasian male diagnosed with at least stage IIIC Small cell lung cancer (T4, N3, M0).  He presented with a large right upper lobe lung mass with mediastinal invasion, right pleural/subpleural involvement, left supraclavicular adenopathy, and possible pelvic peritoneal nodule.  He was diagnosed in March 2025.  The patient is currently undergoing radiation to the large right upper lung mass/CBC syndrome in the last day radiation is tentatively scheduled for 06/06/2023.  The patient was seen with Dr. Arbutus Ped today.  Dr. Arbutus Ped had a lengthy discussion with the patient today about his current condition and treatment options.  Dr. Arbutus Ped discussed  that the patient has a very serious condition and without treatment average life expectancy is approximately 3 months.  However treatment may prolong his life and improve quality of life.  Standard treatment is  palliative systemic chemotherapy with carboplatin for an AUC of 5 day 1 and etoposide 100 mg/m on day 1, 2, and 3.   As of right now, the patient is reluctant to pursue treatment.  He is leaning towards not pursuing any treatment at this time but he will talk to his family and let us know.  The patient understands that without treatment, we do expect his condition to worsen which may preclude him from working which seems to be his focus.  As of right now he should be okay to work but his condition may deteriorate with progressive disease.  We will write a note for his work to explain this.  The patient decides to pursue treatment, I recommended that he let me know as soon as possible so we may start working on insurance authorization as typically people need to start treatment as soon as possible with small cell lung cancer.  The adverse side effects of treatment were discussed including but not limited to alopecia, myelosuppression, fatigue, nausea, vomiting, risk of infection, kidney, liver dysfunction.  He decides to pursue treatment, I would need to arrange for a chemo education class and we will talk to him about his interest in a Port-A-Cath.  Will not arrange for any follow-ups at this time as the patient will let us know his decision.  If his work has a specific form they would like [place] to complete regarding his work duties, he was advised to bring that to the clinic.  The patient was advised to call immediately if he has any concerning symptoms in the interval. The patient voices understanding of current disease status and treatment options and is in agreement with the current care plan. All questions were answered. The patient knows to call the clinic with any problems, questions or concerns. We can certainly see the patient much sooner if necessary   No orders of the defined types were placed in this encounter.    Heraclio Seidman L Kerrigan Gombos,  PA-C 04/29/23  ADDENDUM: Hematology/Oncology Attending:  I had a face-to-face encounter with the patient today.  I reviewed his record, lab, scans and recommended his care plan.  This is a very pleasant 53 years old white male recently diagnosed with extensive stage (T4, N3, M1b) small cell lung cancer presented with large right upper lobe lung mass with mediastinal invasion in addition to right pleural/subpleural involvement and left supraclavicular as well as pelvic peritoneal lymphadenopathy diagnosed in April 2025.  The patient underwent a course of palliative radiotherapy to the obstructive right upper lobe lung mass under the care of Dr. Kathrynn Running with some improvement of his symptoms. I had a lengthy discussion with the patient today about his current condition and treatment options.  I explained to the patient that small cell lung cancer especially extensive stage is incurable condition and all the treatment will be of palliative nature. I gave him the option of palliative care on hospice referral with a life expectancy of around 3 months versus consideration of a course of chemoimmunotherapy with carboplatin for AUC of 5 from day 1, etoposide 100 Mg/M2 on days 1, 2 and 3 with Cosela 240 Mg/M2 on the days of the chemotherapy every 3 weeks for 4 cycles followed by maintenance treatment with Imfinzi every 4 weeks if the patient has  no evidence for disease progression after the induction phase. The average life expectancy with chemoimmunotherapy is around 13 months.  The patient is very hesitant about consideration of systemic chemotherapy and he would like to continue to work at regular basis but unfortunately if he does not consider treatment his life expectancy and ability to work will be very limited to a short.. I discussed with the patient the adverse effect of the chemotherapy including but not limited to alopecia, myelosuppression, nausea and vomiting, peripheral neuropathy, liver or renal  dysfunction as well as immunotherapy adverse effects. After the visit the patient called back and he said that he is interested in consideration of treatment. I will arrange for him to start his first cycle of the treatment in around 7 to 10 days. He will come back for follow-up visit 1 week after the start of the treatment for management of any adverse effect of his chemotherapy. The patient was advised to call immediately if he has any other concerning symptoms in the interval. The total time spent in the appointment was 40 minutes. Disclaimer: This note was dictated with voice recognition software. Similar sounding words can inadvertently be transcribed and may be missed upon review. Lajuana Matte, MD

## 2023-04-30 ENCOUNTER — Ambulatory Visit
Admission: RE | Admit: 2023-04-30 | Discharge: 2023-04-30 | Disposition: A | Discharge status: Home / Self Care | Payer: PRIVATE HEALTH INSURANCE | Source: Ambulatory Visit | Attending: Radiation Oncology

## 2023-04-30 ENCOUNTER — Other Ambulatory Visit: Payer: Self-pay

## 2023-04-30 ENCOUNTER — Other Ambulatory Visit: Payer: Self-pay | Admitting: Physician Assistant

## 2023-04-30 DIAGNOSIS — Z5111 Encounter for antineoplastic chemotherapy: Secondary | ICD-10-CM | POA: Diagnosis not present

## 2023-04-30 DIAGNOSIS — R918 Other nonspecific abnormal finding of lung field: Secondary | ICD-10-CM

## 2023-04-30 LAB — RAD ONC ARIA SESSION SUMMARY
Course Elapsed Days: 14
Plan Fractions Treated to Date: 7
Plan Prescribed Dose Per Fraction: 2 Gy
Plan Total Fractions Prescribed: 35
Plan Total Prescribed Dose: 70 Gy
Reference Point Dosage Given to Date: 14 Gy
Reference Point Session Dosage Given: 2 Gy
Session Number: 7

## 2023-05-01 ENCOUNTER — Telehealth: Payer: Self-pay

## 2023-05-01 ENCOUNTER — Other Ambulatory Visit: Payer: Self-pay

## 2023-05-01 ENCOUNTER — Ambulatory Visit
Admission: RE | Admit: 2023-05-01 | Discharge: 2023-05-01 | Disposition: A | Discharge status: Home / Self Care | Payer: PRIVATE HEALTH INSURANCE | Source: Ambulatory Visit | Attending: Radiation Oncology | Admitting: Radiation Oncology

## 2023-05-01 ENCOUNTER — Other Ambulatory Visit: Payer: Self-pay | Admitting: Physician Assistant

## 2023-05-01 ENCOUNTER — Inpatient Hospital Stay: Payer: PRIVATE HEALTH INSURANCE | Attending: Internal Medicine

## 2023-05-01 ENCOUNTER — Encounter: Payer: Self-pay | Admitting: Medical Oncology

## 2023-05-01 ENCOUNTER — Inpatient Hospital Stay (HOSPITAL_BASED_OUTPATIENT_CLINIC_OR_DEPARTMENT_OTHER): Payer: PRIVATE HEALTH INSURANCE | Admitting: Physician Assistant

## 2023-05-01 VITALS — BP 124/68 | HR 107 | Temp 98.1°F | Resp 16 | Ht 72.0 in | Wt 177.5 lb

## 2023-05-01 DIAGNOSIS — Z5111 Encounter for antineoplastic chemotherapy: Secondary | ICD-10-CM | POA: Insufficient documentation

## 2023-05-01 DIAGNOSIS — Z7189 Other specified counseling: Secondary | ICD-10-CM | POA: Insufficient documentation

## 2023-05-01 DIAGNOSIS — I871 Compression of vein: Secondary | ICD-10-CM

## 2023-05-01 DIAGNOSIS — R918 Other nonspecific abnormal finding of lung field: Secondary | ICD-10-CM

## 2023-05-01 DIAGNOSIS — C3490 Malignant neoplasm of unspecified part of unspecified bronchus or lung: Secondary | ICD-10-CM | POA: Insufficient documentation

## 2023-05-01 DIAGNOSIS — R630 Anorexia: Secondary | ICD-10-CM | POA: Insufficient documentation

## 2023-05-01 DIAGNOSIS — C3411 Malignant neoplasm of upper lobe, right bronchus or lung: Secondary | ICD-10-CM

## 2023-05-01 DIAGNOSIS — M7989 Other specified soft tissue disorders: Secondary | ICD-10-CM | POA: Insufficient documentation

## 2023-05-01 DIAGNOSIS — M79602 Pain in left arm: Secondary | ICD-10-CM | POA: Insufficient documentation

## 2023-05-01 DIAGNOSIS — Z452 Encounter for adjustment and management of vascular access device: Secondary | ICD-10-CM | POA: Insufficient documentation

## 2023-05-01 DIAGNOSIS — D701 Agranulocytosis secondary to cancer chemotherapy: Secondary | ICD-10-CM | POA: Insufficient documentation

## 2023-05-01 DIAGNOSIS — F17210 Nicotine dependence, cigarettes, uncomplicated: Secondary | ICD-10-CM | POA: Insufficient documentation

## 2023-05-01 LAB — RAD ONC ARIA SESSION SUMMARY
Course Elapsed Days: 15
Plan Fractions Treated to Date: 8
Plan Prescribed Dose Per Fraction: 2 Gy
Plan Total Fractions Prescribed: 35
Plan Total Prescribed Dose: 70 Gy
Reference Point Dosage Given to Date: 16 Gy
Reference Point Session Dosage Given: 2 Gy
Session Number: 8

## 2023-05-01 LAB — CMP (CANCER CENTER ONLY)
ALT: 13 U/L (ref 0–44)
AST: 14 U/L — ABNORMAL LOW (ref 15–41)
Albumin: 3.7 g/dL (ref 3.5–5.0)
Alkaline Phosphatase: 54 U/L (ref 38–126)
Anion gap: 5 (ref 5–15)
BUN: 19 mg/dL (ref 6–20)
CO2: 32 mmol/L (ref 22–32)
Calcium: 9.7 mg/dL (ref 8.9–10.3)
Chloride: 103 mmol/L (ref 98–111)
Creatinine: 0.89 mg/dL (ref 0.61–1.24)
GFR, Estimated: >60 mL/min (ref >60)
Glucose, Bld: 134 mg/dL — ABNORMAL HIGH (ref 70–99)
Potassium: 4.2 mmol/L (ref 3.5–5.1)
Sodium: 140 mmol/L (ref 135–145)
Total Bilirubin: 0.6 mg/dL (ref 0.0–1.2)
Total Protein: 7 g/dL (ref 6.5–8.1)

## 2023-05-01 LAB — CBC WITH DIFFERENTIAL (CANCER CENTER ONLY)
Abs Immature Granulocytes: 0.07 10*3/uL (ref 0.00–0.07)
Basophils Absolute: 0.1 10*3/uL (ref 0.0–0.1)
Basophils Relative: 1 %
Eosinophils Absolute: 0.1 10*3/uL (ref 0.0–0.5)
Eosinophils Relative: 1 %
HCT: 40.2 % (ref 39.0–52.0)
Hemoglobin: 13.6 g/dL (ref 13.0–17.0)
Immature Granulocytes: 1 %
Lymphocytes Relative: 15 %
Lymphs Abs: 0.8 10*3/uL (ref 0.7–4.0)
MCH: 29.9 pg (ref 26.0–34.0)
MCHC: 33.8 g/dL (ref 30.0–36.0)
MCV: 88.4 fL (ref 80.0–100.0)
Monocytes Absolute: 0.9 10*3/uL (ref 0.1–1.0)
Monocytes Relative: 18 %
Neutro Abs: 3.3 10*3/uL (ref 1.7–7.7)
Neutrophils Relative %: 64 %
Platelet Count: 262 10*3/uL (ref 150–400)
RBC: 4.55 MIL/uL (ref 4.22–5.81)
RDW: 14.6 % (ref 11.5–15.5)
WBC Count: 5.2 10*3/uL (ref 4.0–10.5)
nRBC: 0 % (ref 0.0–0.2)

## 2023-05-01 MED ORDER — PROCHLORPERAZINE MALEATE 10 MG PO TABS
10.0000 mg | ORAL_TABLET | Freq: Four times a day (QID) | ORAL | 2 refills | Status: DC | PRN
Start: 2023-05-01 — End: 2023-07-31

## 2023-05-01 NOTE — Progress Notes (Signed)
Return to work letter given to patient

## 2023-05-01 NOTE — Progress Notes (Signed)
 START ON PATHWAY REGIMEN - Small Cell Lung     Cycles 1 through 4: A cycle is every 21 days:     Durvalumab      Carboplatin      Etoposide    Cycles 5 and beyond: A cycle is every 28 days:     Durvalumab   **Always confirm dose/schedule in your pharmacy ordering system**  Patient Characteristics: Newly Diagnosed, Preoperative or Nonsurgical Candidate (Clinical Staging), First Line, Extensive Stage Therapeutic Status: Newly Diagnosed, Preoperative or Nonsurgical Candidate (Clinical Staging) AJCC T Category: cT4 AJCC N Category: cN3 AJCC M Category: cM1b AJCC 9 Stage Grouping: IVA Check here if patient was staged using an edition other than AJCC Staging 9th Edition: false Stage Classification: Extensive Intent of Therapy: Non-Curative / Palliative Intent, Discussed with Patient

## 2023-05-01 NOTE — Telephone Encounter (Signed)
 Patient was seen in the office today with Cassie, PA.  Patient stated that he wanted to go home and speak with his wife about proceeding with chemotherapy.  Patient has decided to proceed with treatment.  Message sent to providers. Informed patient scheduling will call with appt dates and times. Patient verbalized understanding.

## 2023-05-01 NOTE — Patient Instructions (Addendum)
-  There are two main categories of lung cancer, they are named based on the size of the cancer cell. One is called Non-Small cell lung cancer. The other type is Small Cell Lung Cancer -The sample (biopsy) that they took of your tumor was consistent with Small Cell Lung Cancer -We covered a lot of important information at your appointment today regarding what the treatment plan is moving forward. Here are the the main points that were discussed at your office visit with [place] today:  -The treatment that you will receive consists of Two chemotherapy drugs, called Carboplatin and Etoposide and One Immunotherapy drug called Imfinzi (Durvalumab)  -We are planning on starting your treatment next week on 05/07/23 but before your start your treatment, I would like you to attend a Chemotherapy Education Class. This involves having you sit down with one of our nurse educators. She will discuss with your one-on-one more details about your treatment as well as general information about resources here at the [facility].  -Your treatment will be given for three consecutive days every 3 weeks. We will check your labs once a week for the first ~4 treatments just to make sure that important components of your blood are in an acceptable range -We will get a CT scan after 2 treatments to check on the progress of treatment  Medications:  -I have sent a few important medication prescriptions to your pharmacy.  -Compazine was sent to your pharmacy. This medication is for nausea. You may take this every 6 hours as needed if you feel nausous. .   Side Effects:  -The adverse effect of this treatment including but not limited to alopecia (losing your hair), myelosuppression (drops in the blood counts), nausea and vomiting, peripheral neuropathy (numbness and tingling in the hands and feet), liver or renal dysfunction.   Referrals:   Imaging:   Follow up:  -We will see you back for a follow up visit in __ weeks.

## 2023-05-02 ENCOUNTER — Other Ambulatory Visit: Payer: Self-pay

## 2023-05-02 ENCOUNTER — Other Ambulatory Visit: Payer: Self-pay | Admitting: Physician Assistant

## 2023-05-02 ENCOUNTER — Encounter: Payer: Self-pay | Admitting: Internal Medicine

## 2023-05-02 ENCOUNTER — Ambulatory Visit: Payer: PRIVATE HEALTH INSURANCE

## 2023-05-02 ENCOUNTER — Telehealth: Payer: Self-pay | Admitting: Internal Medicine

## 2023-05-02 ENCOUNTER — Encounter (HOSPITAL_COMMUNITY): Payer: Self-pay

## 2023-05-02 DIAGNOSIS — C3490 Malignant neoplasm of unspecified part of unspecified bronchus or lung: Secondary | ICD-10-CM

## 2023-05-02 MED ORDER — LIDOCAINE-PRILOCAINE 2.5-2.5 % EX CREA: 1.0000 | TOPICAL_CREAM | CUTANEOUS | 2 refills | Status: AC | PRN

## 2023-05-02 NOTE — Telephone Encounter (Signed)
 Called the patient and scheduled out all appointments. The patient is aware and active on MyChart.

## 2023-05-02 NOTE — Progress Notes (Signed)
 Pt returned by call at this time. I wanted to know if he was interested in getting a portacath so he doesn't have to have a PIV for every treatment. Pt is interested in the port and would prefer to have it done. I provided verbal education on how the port is placed and the benefits of having one. Pt states he would like to have the port placed at [facility].  I reached out to [facility], [facility] IR Scheduler, via [employer]. Tiffany states that the first available appt isn't until 4/22. I asked if there was anything on 4/25 as the pt has chemo 4/21-4/23. Tiffany states there is an appt on 4/25 with an arrival time of 11:30. I placed the order for the port and it was scheduled. I let the pt know I will try to coordinate with Rad onc to move his IMRT that day to before the port appt.  I asked if pt has any trouble with child care so his wife can attend more appts with him and pt states They have friends that can watch them while they are away.

## 2023-05-02 NOTE — Progress Notes (Signed)
 The proposed treatment discussed in conference is for discussion purpose only and is not a binding recommendation.  The patients have not been physically examined, or presented with their treatment options.  Therefore, final treatment plans cannot be decided.

## 2023-05-02 NOTE — Progress Notes (Signed)
 Saw patient today to review his pathology results from his bronchoscopy on 4/1. Path results came back as small cell lung cancer, extensive stage. Pt has opted to do chemo. Pt understands it is a 3 day regimen every 3 weeks for 4 cycles, with weekly labs in between cycles. I let the pt know that we will contact him with treatment schedule once it has been arranged. Pt aware he will need a chemo class. I gave my card to the pt again and encouraged him to call me as needed.

## 2023-05-03 ENCOUNTER — Other Ambulatory Visit: Payer: Self-pay

## 2023-05-05 ENCOUNTER — Ambulatory Visit: Payer: PRIVATE HEALTH INSURANCE

## 2023-05-05 ENCOUNTER — Other Ambulatory Visit: Payer: Self-pay | Admitting: Internal Medicine

## 2023-05-05 ENCOUNTER — Other Ambulatory Visit: Payer: Self-pay

## 2023-05-05 DIAGNOSIS — C3411 Malignant neoplasm of upper lobe, right bronchus or lung: Secondary | ICD-10-CM

## 2023-05-05 NOTE — Progress Notes (Signed)
 Pt returned my call regarding the change in his treatment. Pt is concerned about how bad his body is going to get "torn down" by having the radiation and the chemotherapy together, and if there is anything he can do to mitigate the effects of getting them together. Pt states he will not quit working "as long as I can stand. I have a family to take care of" I verbalized pts understanding of his frustration and implored him to mention these concerns to Dr.Manning as his appt with him tomorrow. I also sent Dr Lorri Rota a [place] about the pt's concerns. Dr Lorri Rota confirmed receipt of the message. Pt states he will talk to the pt about his concerns tomorrow at this appt.  I also ordered an urgent dietary consult to help address the pt's wt loss and alteration in his taste.

## 2023-05-05 NOTE — Progress Notes (Signed)
 Pharmacist Chemotherapy Monitoring - Initial Assessment    Anticipated start date: 05/12/23   The following has been reviewed per standard work regarding the patient's treatment regimen: The patient's diagnosis, treatment plan and drug doses, and organ/hematologic function Lab orders and baseline tests specific to treatment regimen  The treatment plan start date, drug sequencing, and pre-medications Prior authorization status  Patient's documented medication list, including drug-drug interaction screen and prescriptions for anti-emetics and supportive care specific to the treatment regimen The drug concentrations, fluid compatibility, administration routes, and timing of the medications to be used The patient's access for treatment and lifetime cumulative dose history, if applicable  The patient's medication allergies and previous infusion related reactions, if applicable   Changes made to treatment plan:  N/A  Follow up needed:  Pending authorization for treatment  and port placement   Uvaldo Rybacki, PharmD, MBA

## 2023-05-05 NOTE — Progress Notes (Signed)
 Reached out to the pt at this time to update him on his plan of care. Pt will continue to receive his radiation and start just the carbo/etoposide for his first cycle of chemo. Pt had called me earlier today to find out what the plan would be. I was unable to reach the pt at this time, so I left a VM with the details of the treatment plan and included my direct number if he has any questions.

## 2023-05-06 ENCOUNTER — Encounter: Payer: Self-pay | Admitting: Internal Medicine

## 2023-05-06 ENCOUNTER — Ambulatory Visit
Admission: RE | Admit: 2023-05-06 | Discharge: 2023-05-06 | Disposition: A | Discharge status: Home / Self Care | Payer: PRIVATE HEALTH INSURANCE | Source: Ambulatory Visit | Attending: Radiation Oncology | Admitting: Radiation Oncology

## 2023-05-06 ENCOUNTER — Ambulatory Visit: Payer: PRIVATE HEALTH INSURANCE

## 2023-05-06 ENCOUNTER — Other Ambulatory Visit: Payer: Self-pay

## 2023-05-06 DIAGNOSIS — Z5111 Encounter for antineoplastic chemotherapy: Secondary | ICD-10-CM | POA: Diagnosis not present

## 2023-05-06 LAB — RAD ONC ARIA SESSION SUMMARY
Course Elapsed Days: 20
Plan Fractions Treated to Date: 9
Plan Prescribed Dose Per Fraction: 2 Gy
Plan Total Fractions Prescribed: 35
Plan Total Prescribed Dose: 70 Gy
Reference Point Dosage Given to Date: 18 Gy
Reference Point Session Dosage Given: 2 Gy
Session Number: 9

## 2023-05-07 ENCOUNTER — Ambulatory Visit
Admission: RE | Admit: 2023-05-07 | Discharge: 2023-05-07 | Disposition: A | Discharge status: Home / Self Care | Payer: PRIVATE HEALTH INSURANCE | Source: Ambulatory Visit | Attending: Radiation Oncology | Admitting: Radiation Oncology

## 2023-05-07 ENCOUNTER — Other Ambulatory Visit: Payer: Self-pay

## 2023-05-07 DIAGNOSIS — Z5111 Encounter for antineoplastic chemotherapy: Secondary | ICD-10-CM | POA: Diagnosis not present

## 2023-05-07 LAB — RAD ONC ARIA SESSION SUMMARY
Course Elapsed Days: 21
Plan Fractions Treated to Date: 10
Plan Prescribed Dose Per Fraction: 2 Gy
Plan Total Fractions Prescribed: 35
Plan Total Prescribed Dose: 70 Gy
Reference Point Dosage Given to Date: 20 Gy
Reference Point Session Dosage Given: 2 Gy
Session Number: 10

## 2023-05-08 ENCOUNTER — Other Ambulatory Visit: Payer: Self-pay

## 2023-05-08 ENCOUNTER — Ambulatory Visit
Admission: RE | Admit: 2023-05-08 | Discharge: 2023-05-08 | Disposition: A | Discharge status: Home / Self Care | Payer: PRIVATE HEALTH INSURANCE | Source: Ambulatory Visit | Attending: Radiation Oncology | Admitting: Radiation Oncology

## 2023-05-08 DIAGNOSIS — Z5111 Encounter for antineoplastic chemotherapy: Secondary | ICD-10-CM | POA: Diagnosis not present

## 2023-05-08 LAB — RAD ONC ARIA SESSION SUMMARY
Course Elapsed Days: 22
Plan Fractions Treated to Date: 11
Plan Prescribed Dose Per Fraction: 2 Gy
Plan Total Fractions Prescribed: 35
Plan Total Prescribed Dose: 70 Gy
Reference Point Dosage Given to Date: 22 Gy
Reference Point Session Dosage Given: 2 Gy
Session Number: 11

## 2023-05-09 ENCOUNTER — Ambulatory Visit
Admission: RE | Admit: 2023-05-09 | Discharge: 2023-05-09 | Disposition: A | Discharge status: Home / Self Care | Payer: PRIVATE HEALTH INSURANCE | Source: Ambulatory Visit | Attending: Radiation Oncology | Admitting: Radiation Oncology

## 2023-05-09 ENCOUNTER — Inpatient Hospital Stay: Payer: PRIVATE HEALTH INSURANCE

## 2023-05-09 ENCOUNTER — Other Ambulatory Visit: Payer: Self-pay

## 2023-05-09 ENCOUNTER — Other Ambulatory Visit: Payer: Self-pay | Admitting: Pharmacist

## 2023-05-09 DIAGNOSIS — Z5111 Encounter for antineoplastic chemotherapy: Secondary | ICD-10-CM | POA: Diagnosis not present

## 2023-05-09 LAB — RAD ONC ARIA SESSION SUMMARY
Course Elapsed Days: 23
Plan Fractions Treated to Date: 12
Plan Prescribed Dose Per Fraction: 2 Gy
Plan Total Fractions Prescribed: 35
Plan Total Prescribed Dose: 70 Gy
Reference Point Dosage Given to Date: 24 Gy
Reference Point Session Dosage Given: 2 Gy
Session Number: 12

## 2023-05-11 ENCOUNTER — Encounter: Payer: Self-pay | Admitting: Internal Medicine

## 2023-05-12 ENCOUNTER — Other Ambulatory Visit

## 2023-05-12 ENCOUNTER — Ambulatory Visit: Payer: PRIVATE HEALTH INSURANCE

## 2023-05-12 ENCOUNTER — Other Ambulatory Visit: Payer: Self-pay

## 2023-05-12 ENCOUNTER — Ambulatory Visit
Admission: RE | Admit: 2023-05-12 | Discharge: 2023-05-12 | Disposition: A | Discharge status: Home / Self Care | Payer: PRIVATE HEALTH INSURANCE | Source: Ambulatory Visit | Attending: Radiation Oncology | Admitting: Radiation Oncology

## 2023-05-12 ENCOUNTER — Inpatient Hospital Stay: Payer: PRIVATE HEALTH INSURANCE

## 2023-05-12 ENCOUNTER — Encounter: Payer: Self-pay | Admitting: Internal Medicine

## 2023-05-12 VITALS — BP 116/76 | HR 91 | Temp 97.9°F | Resp 18 | Ht 72.0 in | Wt 176.0 lb

## 2023-05-12 DIAGNOSIS — C3411 Malignant neoplasm of upper lobe, right bronchus or lung: Secondary | ICD-10-CM

## 2023-05-12 DIAGNOSIS — Z5111 Encounter for antineoplastic chemotherapy: Secondary | ICD-10-CM | POA: Diagnosis not present

## 2023-05-12 LAB — CBC WITH DIFFERENTIAL (CANCER CENTER ONLY)
Abs Immature Granulocytes: 0.04 10*3/uL (ref 0.00–0.07)
Basophils Absolute: 0.1 10*3/uL (ref 0.0–0.1)
Basophils Relative: 1 %
Eosinophils Absolute: 0.1 10*3/uL (ref 0.0–0.5)
Eosinophils Relative: 2 %
HCT: 38.1 % — ABNORMAL LOW (ref 39.0–52.0)
Hemoglobin: 13 g/dL (ref 13.0–17.0)
Immature Granulocytes: 1 %
Lymphocytes Relative: 12 %
Lymphs Abs: 0.5 10*3/uL — ABNORMAL LOW (ref 0.7–4.0)
MCH: 30.4 pg (ref 26.0–34.0)
MCHC: 34.1 g/dL (ref 30.0–36.0)
MCV: 89.2 fL (ref 80.0–100.0)
Monocytes Absolute: 0.5 10*3/uL (ref 0.1–1.0)
Monocytes Relative: 12 %
Neutro Abs: 2.9 10*3/uL (ref 1.7–7.7)
Neutrophils Relative %: 72 %
Platelet Count: 242 10*3/uL (ref 150–400)
RBC: 4.27 MIL/uL (ref 4.22–5.81)
RDW: 15.2 % (ref 11.5–15.5)
WBC Count: 4 10*3/uL (ref 4.0–10.5)
nRBC: 0 % (ref 0.0–0.2)

## 2023-05-12 LAB — CMP (CANCER CENTER ONLY)
ALT: 8 U/L (ref 0–44)
AST: 12 U/L — ABNORMAL LOW (ref 15–41)
Albumin: 3.7 g/dL (ref 3.5–5.0)
Alkaline Phosphatase: 53 U/L (ref 38–126)
Anion gap: 6 (ref 5–15)
BUN: 15 mg/dL (ref 6–20)
CO2: 29 mmol/L (ref 22–32)
Calcium: 9.4 mg/dL (ref 8.9–10.3)
Chloride: 104 mmol/L (ref 98–111)
Creatinine: 0.91 mg/dL (ref 0.61–1.24)
GFR, Estimated: >60 mL/min (ref >60)
Glucose, Bld: 191 mg/dL — ABNORMAL HIGH (ref 70–99)
Potassium: 3.9 mmol/L (ref 3.5–5.1)
Sodium: 139 mmol/L (ref 135–145)
Total Bilirubin: 0.5 mg/dL (ref 0.0–1.2)
Total Protein: 6.7 g/dL (ref 6.5–8.1)

## 2023-05-12 LAB — RAD ONC ARIA SESSION SUMMARY
Course Elapsed Days: 26
Plan Fractions Treated to Date: 13
Plan Prescribed Dose Per Fraction: 2 Gy
Plan Total Fractions Prescribed: 35
Plan Total Prescribed Dose: 70 Gy
Reference Point Dosage Given to Date: 26 Gy
Reference Point Session Dosage Given: 2 Gy
Session Number: 13

## 2023-05-12 LAB — TSH: TSH: 0.724 u[IU]/mL (ref 0.350–4.500)

## 2023-05-12 MED ORDER — SODIUM CHLORIDE 0.9 % IV SOLN
100.0000 mg/m2 | Freq: Once | INTRAVENOUS | Status: AC
  Administered 2023-05-12: 200 mg via INTRAVENOUS
  Filled 2023-05-12: qty 10

## 2023-05-12 MED ORDER — PALONOSETRON HCL INJECTION 0.25 MG/5ML
0.2500 mg | Freq: Once | INTRAVENOUS | Status: AC
  Administered 2023-05-12: 0.25 mg via INTRAVENOUS
  Filled 2023-05-12: qty 5

## 2023-05-12 MED ORDER — SODIUM CHLORIDE 0.9 % IV SOLN: INTRAVENOUS | Status: DC

## 2023-05-12 MED ORDER — SODIUM CHLORIDE 0.9 % IV SOLN
150.0000 mg | Freq: Once | INTRAVENOUS | Status: AC
  Administered 2023-05-12: 150 mg via INTRAVENOUS
  Filled 2023-05-12: qty 150

## 2023-05-12 MED ORDER — SODIUM CHLORIDE 0.9 % IV SOLN
665.0000 mg | Freq: Once | INTRAVENOUS | Status: AC
  Administered 2023-05-12: 670 mg via INTRAVENOUS
  Filled 2023-05-12: qty 67

## 2023-05-12 MED ORDER — DEXAMETHASONE SODIUM PHOSPHATE 10 MG/ML IJ SOLN
10.0000 mg | Freq: Once | INTRAMUSCULAR | Status: AC
  Administered 2023-05-12: 10 mg via INTRAVENOUS
  Filled 2023-05-12: qty 1

## 2023-05-12 MED ORDER — TRILACICLIB DIHYDROCHLORIDE INJECTION 300 MG
240.0000 mg/m2 | Freq: Once | INTRAVENOUS | Status: AC
Start: 2023-05-12 — End: 2023-05-12
  Administered 2023-05-12: 480 mg via INTRAVENOUS
  Filled 2023-05-12: qty 32

## 2023-05-12 NOTE — Progress Notes (Signed)
 Nutrition Assessment   Reason for Assessment:   Referral received from Dr Liam Redhead   ASSESSMENT:  53 year old male with small cell lung cancer.  Followed by Dr Liam Redhead.  Past medical history of COPD.  Patient receiving concurrent chemotherapy/immunotherapy (carboplatin , etoposide , durvalumab) and radiation.   Met with patient during infusion.  Patient reports that appetite is so-so and has been for the last few weeks.  Drinks a ensure plus around 4:30am.  Has a sandwich around 9:30am and another sandwith around noon.  Sometimes has nabs, crackers, chips to go with sandwich.  Dinner is meat and couple of sides or spaghetti.  Reports metallic taste in his mouth and switching to plastic utensils has helped.  Usually drinks another ensure in the evening. Denies nausea or painful swallowing    Medications: prednisone , compazine    Labs: glucose 191   Anthropometrics:   Height: 72 inches Weight: 176 lb today UBW: 180-190 lb  180 lb noted on 03/19/23 BMI: 23  2% weight loss in the last 2 months   Estimated Energy Needs  Kcals: 2000-2400 Protein: 100-120 g Fluid: 2000-2400 ml   NUTRITION DIAGNOSIS: Inadequate oral intake related to cancer and related treatment side effects as evidenced by 2% weight los sin the last 2 months and decreased intake for a few weeks    INTERVENTION:  Encouraged foods rich in protein and calories. Encouraged 350 calorie shake BID. Coupons given Contact information provided   MONITORING, EVALUATION, GOAL: weight trends, intake   Next Visit: Monday, May 12 during infusion  Jamillia Closson B. Zollie Hipp, CSO, LDN Registered Dietitian [phone]

## 2023-05-12 NOTE — Patient Instructions (Signed)
 [facility] - A DEPT OF [place]  Discharge Instructions: Thank you for choosing [facility] to provide your oncology and hematology care.   If you have a lab appointment with the Cancer Center, please go directly to the Cancer Center and check in at the registration area.   Wear comfortable clothing and clothing appropriate for easy access to any Portacath or PICC line.   We strive to give you quality time with your provider. You may need to reschedule your appointment if you arrive late (15 or more minutes).  Arriving late affects you and other patients whose appointments are after yours.  Also, if you miss three or more appointments without notifying the office, you may be dismissed from the clinic at the provider's discretion.      For prescription refill requests, have your pharmacy contact our office and allow 72 hours for refills to be completed.    Today you received the following chemotherapy and/or immunotherapy agents CARBOPLATIN  and ETOPOSIDE  + Cosela    To help prevent nausea and vomiting after your treatment, we encourage you to take your nausea medication as directed.  BELOW ARE SYMPTOMS THAT SHOULD BE REPORTED IMMEDIATELY: *FEVER GREATER THAN 100.4 F (38 C) OR HIGHER *CHILLS OR SWEATING *NAUSEA AND VOMITING THAT IS NOT CONTROLLED WITH YOUR NAUSEA MEDICATION *UNUSUAL SHORTNESS OF BREATH *UNUSUAL BRUISING OR BLEEDING *URINARY PROBLEMS (pain or burning when urinating, or frequent urination) *BOWEL PROBLEMS (unusual diarrhea, constipation, pain near the anus) TENDERNESS IN MOUTH AND THROAT WITH OR WITHOUT PRESENCE OF ULCERS (sore throat, sores in mouth, or a toothache) UNUSUAL RASH, SWELLING OR PAIN  UNUSUAL VAGINAL DISCHARGE OR ITCHING   Items with * indicate a potential emergency and should be followed up as soon as possible or go to the Emergency Department if any problems should occur.  Please show the CHEMOTHERAPY ALERT  CARD or IMMUNOTHERAPY ALERT CARD at check-in to the Emergency Department and triage nurse.  Should you have questions after your visit or need to cancel or reschedule your appointment, please contact [facility] - A DEPT OF [facility]  Dept: [phone]  and follow the prompts.  Office hours are 8:00 a.m. to 4:30 p.m. Monday - Friday. Please note that voicemails left after 4:00 p.m. may not be returned until the following business day.  We are closed weekends and major holidays. You have access to a nurse at all times for urgent questions. Please call the main number to the clinic Dept: [phone] and follow the prompts.   For any non-urgent questions, you may also contact your provider using MyChart. We now offer e-Visits for anyone 52 and older to request care online for non-urgent symptoms. For details visit [email or web address].   Also download the MyChart app! Go to the app store, search "MyChart", open the app, select [facility], and log in with your MyChart username and password.  Trilaciclib Injection What is this medication? TRILACICLIB (TRYE la SYE klib) prevents low levels of red blood cells, white blood cells, and platelets caused by chemotherapy. It works by protecting the cells in your bone marrow that make these blood cells. This lowers the risk of infection and bleeding. It also reduces the need for blood transfusions. This medicine may be used for other purposes; ask your health care provider or pharmacist if you have questions. COMMON BRAND NAME(S): COSELA  What should I tell my care team before I take this  medication? They need to know if you have any of these conditions: Liver disease An unusual or allergic reaction to trilaciclib, other medications, foods, dyes, or preservatives Pregnant or trying to get pregnant Breastfeeding How should I use this medication? This medication is injected into a vein. It is given by your care team in a  hospital or clinic setting. Talk to your care team about the use of this medication in children. Special care may be needed. Overdosage: If you think you have taken too much of this medicine contact a poison control center or emergency room at once. NOTE: This medicine is only for you. Do not share this medicine with others. What if I miss a dose? Keep appointments for follow-up doses. It is important to not miss your dose. Call your care team if you are unable to keep an appointment. What may interact with this medication? Cisplatin Dalfampridine Dofetilide Metformin This list may not describe all possible interactions. Give your health care provider a list of all the medicines, herbs, non-prescription drugs, or dietary supplements you use. Also tell them if you smoke, drink alcohol, or use illegal drugs. Some items may interact with your medicine. What should I watch for while using this medication? Your condition will be monitored carefully while you are receiving this medication. Talk to your care team if you may be pregnant. Serious birth defects can occur if you take this medication during pregnancy and for 3 weeks after the last dose. You will need a negative pregnancy test before starting this medication. Contraception is recommended while taking this medication and for 3 weeks after the last dose. Your care team can help you find the option that works for you. Do not breastfeed while taking this medication and for 3 weeks after the last dose. This medication may cause infertility. Talk to your care team if you are concerned about your fertility. What side effects may I notice from receiving this medication? Side effects that you should report to your care team as soon as possible: Allergic reactions--skin rash, itching, hives, swelling of the face, lips, tongue, or throat Dry cough, shortness of breath or trouble breathing Painful swelling, warmth, or redness of the skin, blisters or  sores at the infusion site Side effects that usually do not require medical attention (report these to your care team if they continue or are bothersome): Fatigue Headache This list may not describe all possible side effects. Call your doctor for medical advice about side effects. You may report side effects to FDA at 1-800-FDA-1088. Where should I keep my medication? This medication is given in a hospital or clinic. It will not be stored at home. NOTE: This sheet is a summary. It may not cover all possible information. If you have questions about this medicine, talk to your doctor, pharmacist, or health care provider. Carboplatin  Injection What is this medication? CARBOPLATIN  (KAR boe pla tin) treats some types of cancer. It works by slowing down the growth of cancer cells. This medicine may be used for other purposes; ask your health care provider or pharmacist if you have questions. COMMON BRAND NAME(S): Paraplatin  What should I tell my care team before I take this medication? They need to know if you have any of these conditions: Blood disorders Hearing problems Kidney disease Recent or ongoing radiation therapy An unusual or allergic reaction to carboplatin , cisplatin, other medications, foods, dyes, or preservatives Pregnant or trying to get pregnant Breast-feeding How should I use this medication? This medication is injected  into a vein. It is given by your care team in a hospital or clinic setting. Talk to your care team about the use of this medication in children. Special care may be needed. Overdosage: If you think you have taken too much of this medicine contact a poison control center or emergency room at once. NOTE: This medicine is only for you. Do not share this medicine with others. What if I miss a dose? Keep appointments for follow-up doses. It is important not to miss your dose. Call your care team if you are unable to keep an appointment. What may interact with this  medication? Medications for seizures Some antibiotics, such as amikacin, gentamicin, neomycin, streptomycin, tobramycin Vaccines This list may not describe all possible interactions. Give your health care provider a list of all the medicines, herbs, non-prescription drugs, or dietary supplements you use. Also tell them if you smoke, drink alcohol, or use illegal drugs. Some items may interact with your medicine. What should I watch for while using this medication? Your condition will be monitored carefully while you are receiving this medication. You may need blood work while taking this medication. This medication may make you feel generally unwell. This is not uncommon, as chemotherapy can affect healthy cells as well as cancer cells. Report any side effects. Continue your course of treatment even though you feel ill unless your care team tells you to stop. In some cases, you may be given additional medications to help with side effects. Follow all directions for their use. This medication may increase your risk of getting an infection. Call your care team for advice if you get a fever, chills, sore throat, or other symptoms of a cold or flu. Do not treat yourself. Try to avoid being around people who are sick. Avoid taking medications that contain aspirin, acetaminophen, ibuprofen, naproxen, or ketoprofen unless instructed by your care team. These medications may hide a fever. Be careful brushing or flossing your teeth or using a toothpick because you may get an infection or bleed more easily. If you have any dental work done, tell your dentist you are receiving this medication. Talk to your care team if you wish to become pregnant or think you might be pregnant. This medication can cause serious birth defects. Talk to your care team about effective forms of contraception. Do not breast-feed while taking this medication. What side effects may I notice from receiving this medication? Side effects  that you should report to your care team as soon as possible: Allergic reactions--skin rash, itching, hives, swelling of the face, lips, tongue, or throat Infection--fever, chills, cough, sore throat, wounds that don't heal, pain or trouble when passing urine, general feeling of discomfort or being unwell Low red blood cell level--unusual weakness or fatigue, dizziness, headache, trouble breathing Pain, tingling, or numbness in the hands or feet, muscle weakness, change in vision, confusion or trouble speaking, loss of balance or coordination, trouble walking, seizures Unusual bruising or bleeding Side effects that usually do not require medical attention (report to your care team if they continue or are bothersome): Hair loss Nausea Unusual weakness or fatigue Vomiting This list may not describe all possible side effects. Call your doctor for medical advice about side effects. You may report side effects to FDA at 1-800-FDA-1088. Where should I keep my medication? This medication is given in a hospital or clinic. It will not be stored at home. NOTE: This sheet is a summary. It may not cover all possible information. If  you have questions about this medicine, talk to your doctor, pharmacist, or health care provider.  2024 Elsevier/Gold Standard (2021-05-01 00:00:00)  Etoposide  Injection What is this medication? ETOPOSIDE  (e toe POE side) treats some types of cancer. It works by slowing down the growth of cancer cells. This medicine may be used for other purposes; ask your health care provider or pharmacist if you have questions. COMMON BRAND NAME(S): Etopophos, Toposar , VePesid  What should I tell my care team before I take this medication? They need to know if you have any of these conditions: Infection Kidney disease Liver disease Low blood counts, such as low white cell, platelet, red cell counts An unusual or allergic reaction to etoposide , other medications, foods, dyes, or  preservatives If you or your partner are pregnant or trying to get pregnant Breastfeeding How should I use this medication? This medication is injected into a vein. It is given by your care team in a hospital or clinic setting. Talk to your care team about the use of this medication in children. Special care may be needed. Overdosage: If you think you have taken too much of this medicine contact a poison control center or emergency room at once. NOTE: This medicine is only for you. Do not share this medicine with others. What if I miss a dose? Keep appointments for follow-up doses. It is important not to miss your dose. Call your care team if you are unable to keep an appointment. What may interact with this medication? Warfarin This list may not describe all possible interactions. Give your health care provider a list of all the medicines, herbs, non-prescription drugs, or dietary supplements you use. Also tell them if you smoke, drink alcohol, or use illegal drugs. Some items may interact with your medicine. What should I watch for while using this medication? Your condition will be monitored carefully while you are receiving this medication. This medication may make you feel generally unwell. This is not uncommon as chemotherapy can affect healthy cells as well as cancer cells. Report any side effects. Continue your course of treatment even though you feel ill unless your care team tells you to stop. This medication can cause serious side effects. To reduce the risk, your care team may give you other medications to take before receiving this one. Be sure to follow the directions from your care team. This medication may increase your risk of getting an infection. Call your care team for advice if you get a fever, chills, sore throat, or other symptoms of a cold or flu. Do not treat yourself. Try to avoid being around people who are sick. This medication may increase your risk to bruise or bleed.  Call your care team if you notice any unusual bleeding. Talk to your care team about your risk of cancer. You may be more at risk for certain types of cancers if you take this medication. Talk to your care team if you may be pregnant. Serious birth defects can occur if you take this medication during pregnancy and for 6 months after the last dose. You will need a negative pregnancy test before starting this medication. Contraception is recommended while taking this medication and for 6 months after the last dose. Your care team can help you find the option that works for you. If your partner can get pregnant, use a condom during sex while taking this medication and for 4 months after the last dose. Do not breastfeed while taking this medication. This medication may cause  infertility. Talk to your care team if you are concerned about your fertility. What side effects may I notice from receiving this medication? Side effects that you should report to your care team as soon as possible: Allergic reactions--skin rash, itching, hives, swelling of the face, lips, tongue, or throat Infection--fever, chills, cough, sore throat, wounds that don't heal, pain or trouble when passing urine, general feeling of discomfort or being unwell Low red blood cell level--unusual weakness or fatigue, dizziness, headache, trouble breathing Unusual bruising or bleeding Side effects that usually do not require medical attention (report to your care team if they continue or are bothersome): Diarrhea Fatigue Hair loss Loss of appetite Nausea Vomiting This list may not describe all possible side effects. Call your doctor for medical advice about side effects. You may report side effects to FDA at 1-800-FDA-1088. Where should I keep my medication? This medication is given in a hospital or clinic. It will not be stored at home. NOTE: This sheet is a summary. It may not cover all possible information. If you have questions  about this medicine, talk to your doctor, pharmacist, or health care provider.  2024 Elsevier/Gold Standard (2021-05-31 00:00:00)

## 2023-05-12 NOTE — Progress Notes (Signed)
 Pt had some burning along the IV site during Cosela  infusion. Rate decreased and IVF run concurrently/ Melissa G  in pharmacy notified. Cosela  to be diluted in 500cc with next infusion. Pt is scheduled for port placement later this week

## 2023-05-13 ENCOUNTER — Other Ambulatory Visit: Payer: Self-pay

## 2023-05-13 ENCOUNTER — Ambulatory Visit
Admission: RE | Admit: 2023-05-13 | Discharge: 2023-05-13 | Disposition: A | Discharge status: Home / Self Care | Payer: PRIVATE HEALTH INSURANCE | Source: Ambulatory Visit | Attending: Radiation Oncology | Admitting: Radiation Oncology

## 2023-05-13 ENCOUNTER — Inpatient Hospital Stay: Payer: PRIVATE HEALTH INSURANCE

## 2023-05-13 ENCOUNTER — Ambulatory Visit: Admitting: Physician Assistant

## 2023-05-13 ENCOUNTER — Encounter: Payer: Self-pay | Admitting: Internal Medicine

## 2023-05-13 ENCOUNTER — Telehealth: Payer: Self-pay | Admitting: *Deleted

## 2023-05-13 VITALS — BP 136/79 | HR 100 | Temp 98.0°F | Resp 18

## 2023-05-13 DIAGNOSIS — C3411 Malignant neoplasm of upper lobe, right bronchus or lung: Secondary | ICD-10-CM

## 2023-05-13 DIAGNOSIS — Z5111 Encounter for antineoplastic chemotherapy: Secondary | ICD-10-CM | POA: Diagnosis not present

## 2023-05-13 LAB — RAD ONC ARIA SESSION SUMMARY
Course Elapsed Days: 27
Plan Fractions Treated to Date: 14
Plan Prescribed Dose Per Fraction: 2 Gy
Plan Total Fractions Prescribed: 35
Plan Total Prescribed Dose: 70 Gy
Reference Point Dosage Given to Date: 28 Gy
Reference Point Session Dosage Given: 2 Gy
Session Number: 14

## 2023-05-13 LAB — T4: T4, Total: 5.6 ug/dL (ref 4.5–12.0)

## 2023-05-13 MED ORDER — SODIUM CHLORIDE 0.9 % IV SOLN
100.0000 mg/m2 | Freq: Once | INTRAVENOUS | Status: AC
  Administered 2023-05-13: 200 mg via INTRAVENOUS
  Filled 2023-05-13: qty 10

## 2023-05-13 MED ORDER — DEXTROSE 5 % IV SOLN
240.0000 mg/m2 | Freq: Once | INTRAVENOUS | Status: AC
  Administered 2023-05-13: 480 mg via INTRAVENOUS
  Filled 2023-05-13: qty 32

## 2023-05-13 MED ORDER — SODIUM CHLORIDE 0.9 % IV SOLN: INTRAVENOUS | Status: DC

## 2023-05-13 MED ORDER — DEXAMETHASONE SODIUM PHOSPHATE 10 MG/ML IJ SOLN
10.0000 mg | Freq: Once | INTRAMUSCULAR | Status: AC
  Administered 2023-05-13: 10 mg via INTRAVENOUS
  Filled 2023-05-13: qty 1

## 2023-05-13 NOTE — Telephone Encounter (Signed)
-----   Message from Nurse Brigido Canales sent at 05/12/2023  3:07 PM EDT ----- Regarding: Dr. Marguerita Shih 1st tx f/u call Dr. Marguerita Shih 1st tx f/u call - Carbo and Etoposide . Tolerated well. Had some burning at IV site w/ cosela  - improved with Slowed rate. He gets a port later this week

## 2023-05-13 NOTE — Telephone Encounter (Signed)
 Called pt to see how he did with his recent treatment.  Left message on VM to call back & speak with Dr Bing Buff RN.

## 2023-05-13 NOTE — Patient Instructions (Signed)
 [facility] - A DEPT OF [facility]  Discharge Instructions: Thank you for choosing [facility] to provide your oncology and hematology care.   If you have a lab appointment with the Cancer Center, please go directly to the Cancer Center and check in at the registration area.   Wear comfortable clothing and clothing appropriate for easy access to any Portacath or PICC line.   We strive to give you quality time with your provider. You may need to reschedule your appointment if you arrive late (15 or more minutes).  Arriving late affects you and other patients whose appointments are after yours.  Also, if you miss three or more appointments without notifying the office, you may be dismissed from the clinic at the provider's discretion.      For prescription refill requests, have your pharmacy contact our office and allow 72 hours for refills to be completed.    Today you received the following chemotherapy and/or immunotherapy agents: Etoposide/Cosela     To help prevent nausea and vomiting after your treatment, we encourage you to take your nausea medication as directed.  BELOW ARE SYMPTOMS THAT SHOULD BE REPORTED IMMEDIATELY: *FEVER GREATER THAN 100.4 F (38 C) OR HIGHER *CHILLS OR SWEATING *NAUSEA AND VOMITING THAT IS NOT CONTROLLED WITH YOUR NAUSEA MEDICATION *UNUSUAL SHORTNESS OF BREATH *UNUSUAL BRUISING OR BLEEDING *URINARY PROBLEMS (pain or burning when urinating, or frequent urination) *BOWEL PROBLEMS (unusual diarrhea, constipation, pain near the anus) TENDERNESS IN MOUTH AND THROAT WITH OR WITHOUT PRESENCE OF ULCERS (sore throat, sores in mouth, or a toothache) UNUSUAL RASH, SWELLING OR PAIN  UNUSUAL VAGINAL DISCHARGE OR ITCHING   Items with * indicate a potential emergency and should be followed up as soon as possible or go to the Emergency Department if any problems should occur.  Please show the CHEMOTHERAPY ALERT CARD or  IMMUNOTHERAPY ALERT CARD at check-in to the Emergency Department and triage nurse.  Should you have questions after your visit or need to cancel or reschedule your appointment, please contact [facility] - A DEPT OF [facility]  Dept: [phone]  and follow the prompts.  Office hours are 8:00 a.m. to 4:30 p.m. Monday - Friday. Please note that voicemails left after 4:00 p.m. may not be returned until the following business day.  We are closed weekends and major holidays. You have access to a nurse at all times for urgent questions. Please call the main number to the clinic Dept: [phone] and follow the prompts.   For any non-urgent questions, you may also contact your provider using MyChart. We now offer e-Visits for anyone 44 and older to request care online for non-urgent symptoms. For details visit [email or web address].   Also download the MyChart app! Go to the app store, search "MyChart", open the app, select [place], and log in with your MyChart username and password.

## 2023-05-14 ENCOUNTER — Other Ambulatory Visit: Payer: Self-pay

## 2023-05-14 ENCOUNTER — Ambulatory Visit
Admission: RE | Admit: 2023-05-14 | Discharge: 2023-05-14 | Disposition: A | Discharge status: Home / Self Care | Payer: PRIVATE HEALTH INSURANCE | Source: Ambulatory Visit | Attending: Radiation Oncology | Admitting: Radiation Oncology

## 2023-05-14 ENCOUNTER — Inpatient Hospital Stay: Payer: PRIVATE HEALTH INSURANCE

## 2023-05-14 VITALS — BP 99/60 | HR 84 | Temp 97.5°F | Resp 16

## 2023-05-14 DIAGNOSIS — C3411 Malignant neoplasm of upper lobe, right bronchus or lung: Secondary | ICD-10-CM

## 2023-05-14 DIAGNOSIS — Z5111 Encounter for antineoplastic chemotherapy: Secondary | ICD-10-CM | POA: Diagnosis not present

## 2023-05-14 LAB — RAD ONC ARIA SESSION SUMMARY
Course Elapsed Days: 28
Plan Fractions Treated to Date: 15
Plan Prescribed Dose Per Fraction: 2 Gy
Plan Total Fractions Prescribed: 35
Plan Total Prescribed Dose: 70 Gy
Reference Point Dosage Given to Date: 30 Gy
Reference Point Session Dosage Given: 2 Gy
Session Number: 15

## 2023-05-14 MED ORDER — TRILACICLIB DIHYDROCHLORIDE INJECTION 300 MG
240.0000 mg/m2 | Freq: Once | INTRAVENOUS | Status: AC
  Administered 2023-05-14: 480 mg via INTRAVENOUS
  Filled 2023-05-14: qty 32

## 2023-05-14 MED ORDER — DEXAMETHASONE SODIUM PHOSPHATE 10 MG/ML IJ SOLN
10.0000 mg | Freq: Once | INTRAMUSCULAR | Status: AC
  Administered 2023-05-14: 10 mg via INTRAVENOUS
  Filled 2023-05-14: qty 1

## 2023-05-14 MED ORDER — SODIUM CHLORIDE 0.9 % IV SOLN
100.0000 mg/m2 | Freq: Once | INTRAVENOUS | Status: AC
  Administered 2023-05-14: 200 mg via INTRAVENOUS
  Filled 2023-05-14: qty 10

## 2023-05-14 MED ORDER — SODIUM CHLORIDE 0.9 % IV SOLN: INTRAVENOUS | Status: DC

## 2023-05-14 NOTE — Patient Instructions (Signed)
 [facility] - A DEPT OF [facility]  Discharge Instructions: Thank you for choosing [facility] to provide your oncology and hematology care.   If you have a lab appointment with the Cancer Center, please go directly to the Cancer Center and check in at the registration area.   Wear comfortable clothing and clothing appropriate for easy access to any Portacath or PICC line.   We strive to give you quality time with your provider. You may need to reschedule your appointment if you arrive late (15 or more minutes).  Arriving late affects you and other patients whose appointments are after yours.  Also, if you miss three or more appointments without notifying the office, you may be dismissed from the clinic at the provider's discretion.      For prescription refill requests, have your pharmacy contact our office and allow 72 hours for refills to be completed.    Today you received the following chemotherapy and/or immunotherapy agents: Etoposide/Cosela     To help prevent nausea and vomiting after your treatment, we encourage you to take your nausea medication as directed.  BELOW ARE SYMPTOMS THAT SHOULD BE REPORTED IMMEDIATELY: *FEVER GREATER THAN 100.4 F (38 C) OR HIGHER *CHILLS OR SWEATING *NAUSEA AND VOMITING THAT IS NOT CONTROLLED WITH YOUR NAUSEA MEDICATION *UNUSUAL SHORTNESS OF BREATH *UNUSUAL BRUISING OR BLEEDING *URINARY PROBLEMS (pain or burning when urinating, or frequent urination) *BOWEL PROBLEMS (unusual diarrhea, constipation, pain near the anus) TENDERNESS IN MOUTH AND THROAT WITH OR WITHOUT PRESENCE OF ULCERS (sore throat, sores in mouth, or a toothache) UNUSUAL RASH, SWELLING OR PAIN  UNUSUAL VAGINAL DISCHARGE OR ITCHING   Items with * indicate a potential emergency and should be followed up as soon as possible or go to the Emergency Department if any problems should occur.  Please show the CHEMOTHERAPY ALERT CARD or  IMMUNOTHERAPY ALERT CARD at check-in to the Emergency Department and triage nurse.  Should you have questions after your visit or need to cancel or reschedule your appointment, please contact [facility] - A DEPT OF [facility]  Dept: [phone]  and follow the prompts.  Office hours are 8:00 a.m. to 4:30 p.m. Monday - Friday. Please note that voicemails left after 4:00 p.m. may not be returned until the following business day.  We are closed weekends and major holidays. You have access to a nurse at all times for urgent questions. Please call the main number to the clinic Dept: [phone] and follow the prompts.   For any non-urgent questions, you may also contact your provider using MyChart. We now offer e-Visits for anyone 44 and older to request care online for non-urgent symptoms. For details visit [email or web address].   Also download the MyChart app! Go to the app store, search "MyChart", open the app, select [place], and log in with your MyChart username and password.

## 2023-05-15 ENCOUNTER — Encounter: Payer: Self-pay | Admitting: Internal Medicine

## 2023-05-15 ENCOUNTER — Ambulatory Visit
Admission: RE | Admit: 2023-05-15 | Discharge: 2023-05-15 | Disposition: A | Discharge status: Home / Self Care | Payer: PRIVATE HEALTH INSURANCE | Source: Ambulatory Visit | Attending: Radiation Oncology | Admitting: Radiation Oncology

## 2023-05-15 ENCOUNTER — Other Ambulatory Visit: Payer: Self-pay

## 2023-05-15 ENCOUNTER — Other Ambulatory Visit: Payer: Self-pay | Admitting: Interventional Radiology

## 2023-05-15 ENCOUNTER — Ambulatory Visit
Admission: RE | Admit: 2023-05-15 | Discharge: 2023-05-15 | Discharge status: Home / Self Care | Payer: PRIVATE HEALTH INSURANCE | Source: Ambulatory Visit | Attending: Radiation Oncology

## 2023-05-15 DIAGNOSIS — Z5111 Encounter for antineoplastic chemotherapy: Secondary | ICD-10-CM | POA: Diagnosis not present

## 2023-05-15 LAB — RAD ONC ARIA SESSION SUMMARY
Course Elapsed Days: 29
Plan Fractions Treated to Date: 16
Plan Prescribed Dose Per Fraction: 2 Gy
Plan Total Fractions Prescribed: 35
Plan Total Prescribed Dose: 70 Gy
Reference Point Dosage Given to Date: 32 Gy
Reference Point Session Dosage Given: 2 Gy
Session Number: 16

## 2023-05-16 ENCOUNTER — Inpatient Hospital Stay: Payer: PRIVATE HEALTH INSURANCE | Admitting: Physician Assistant

## 2023-05-16 ENCOUNTER — Telehealth: Payer: Self-pay

## 2023-05-16 ENCOUNTER — Ambulatory Visit (HOSPITAL_COMMUNITY)
Admission: RE | Admit: 2023-05-16 | Discharge: 2023-05-16 | Disposition: A | Discharge status: Home / Self Care | Payer: PRIVATE HEALTH INSURANCE | Source: Ambulatory Visit | Attending: Physician Assistant | Admitting: Physician Assistant

## 2023-05-16 ENCOUNTER — Ambulatory Visit
Admission: RE | Admit: 2023-05-16 | Discharge: 2023-05-16 | Disposition: A | Discharge status: Home / Self Care | Payer: PRIVATE HEALTH INSURANCE | Source: Ambulatory Visit | Attending: Radiation Oncology | Admitting: Radiation Oncology

## 2023-05-16 ENCOUNTER — Other Ambulatory Visit: Payer: Self-pay

## 2023-05-16 ENCOUNTER — Encounter (HOSPITAL_COMMUNITY): Payer: Self-pay

## 2023-05-16 VITALS — BP 115/77 | HR 87 | Temp 97.7°F | Resp 16 | Wt 180.3 lb

## 2023-05-16 DIAGNOSIS — F17210 Nicotine dependence, cigarettes, uncomplicated: Secondary | ICD-10-CM | POA: Diagnosis not present

## 2023-05-16 DIAGNOSIS — Z5111 Encounter for antineoplastic chemotherapy: Secondary | ICD-10-CM | POA: Diagnosis not present

## 2023-05-16 DIAGNOSIS — C3411 Malignant neoplasm of upper lobe, right bronchus or lung: Secondary | ICD-10-CM

## 2023-05-16 DIAGNOSIS — T8090XA Unspecified complication following infusion and therapeutic injection, initial encounter: Secondary | ICD-10-CM | POA: Diagnosis not present

## 2023-05-16 DIAGNOSIS — I871 Compression of vein: Secondary | ICD-10-CM

## 2023-05-16 DIAGNOSIS — J449 Chronic obstructive pulmonary disease, unspecified: Secondary | ICD-10-CM | POA: Insufficient documentation

## 2023-05-16 DIAGNOSIS — C3490 Malignant neoplasm of unspecified part of unspecified bronchus or lung: Secondary | ICD-10-CM

## 2023-05-16 HISTORY — PX: IR IMAGING GUIDED PORT INSERTION: IMG5740

## 2023-05-16 LAB — RAD ONC ARIA SESSION SUMMARY
Course Elapsed Days: 30
Plan Fractions Treated to Date: 17
Plan Prescribed Dose Per Fraction: 2 Gy
Plan Total Fractions Prescribed: 35
Plan Total Prescribed Dose: 70 Gy
Reference Point Dosage Given to Date: 34 Gy
Reference Point Session Dosage Given: 2 Gy
Session Number: 17

## 2023-05-16 MED ORDER — LIDOCAINE-EPINEPHRINE 1 %-1:100000 IJ SOLN
INTRAMUSCULAR | Status: AC
  Filled 2023-05-16: qty 1

## 2023-05-16 MED ORDER — LIDOCAINE-EPINEPHRINE 1 %-1:100000 IJ SOLN
20.0000 mL | Freq: Once | INTRAMUSCULAR | Status: AC
  Administered 2023-05-16: 15 mL via INTRADERMAL

## 2023-05-16 MED ORDER — MIDAZOLAM HCL 2 MG/2ML IJ SOLN
INTRAMUSCULAR | Status: AC | PRN
  Administered 2023-05-16 (×2): 1 mg via INTRAVENOUS

## 2023-05-16 MED ORDER — FENTANYL CITRATE (PF) 100 MCG/2ML IJ SOLN
INTRAMUSCULAR | Status: AC | PRN
  Administered 2023-05-16 (×2): 50 ug via INTRAVENOUS

## 2023-05-16 MED ORDER — HEPARIN SOD (PORK) LOCK FLUSH 100 UNIT/ML IV SOLN
500.0000 [IU] | Freq: Once | INTRAVENOUS | Status: AC
  Administered 2023-05-16: 500 [IU] via INTRAVENOUS

## 2023-05-16 MED ORDER — FENTANYL CITRATE (PF) 100 MCG/2ML IJ SOLN
INTRAMUSCULAR | Status: AC
  Filled 2023-05-16: qty 2

## 2023-05-16 MED ORDER — HEPARIN SOD (PORK) LOCK FLUSH 100 UNIT/ML IV SOLN
INTRAVENOUS | Status: AC
  Filled 2023-05-16: qty 5

## 2023-05-16 MED ORDER — SODIUM CHLORIDE 0.9 % IV SOLN: INTRAVENOUS | Status: DC

## 2023-05-16 MED ORDER — MIDAZOLAM HCL 2 MG/2ML IJ SOLN
INTRAMUSCULAR | Status: AC
  Filled 2023-05-16: qty 4

## 2023-05-16 NOTE — Discharge Instructions (Signed)
Discharge Instructions:   Please call Interventional Radiology clinic [phone] with any questions or concerns.  You may remove your dressing and shower tomorrow.  Do not use EMLA / Lidocaine cream for 2 weeks post Port Insertion.  Implanted Port Insertion, Care After The following information offers guidance on how to care for yourself after your procedure. Your health care provider may also give you more specific instructions. If you have problems or questions, contact your health care provider. What can I expect after the procedure? After the procedure, it is common to have: Discomfort at the port insertion site. Bruising on the skin over the port. This should improve over 3-4 days. Follow these instructions at home: Port care After your port is placed, you will get a manufacturer's information card. The card has information about your port. Keep this card with you at all times. Take care of the port as told by your health care provider. Ask your health care provider if you or a family member can get training for taking care of the port at home. A home health care nurse will be be available to help care for the port. Make sure to remember what type of port you have. Incision care     Follow instructions from your health care provider about how to take care of your port insertion site. Make sure you: Wash your hands with soap and water for at least 20 seconds before and after you change your bandage (dressing). If soap and water are not available, use hand sanitizer. Change your dressing as told by your health care provider. Leave stitches (sutures), skin glue, or adhesive strips in place. These skin closures may need to stay in place for 2 weeks or longer. If adhesive strip edges start to loosen and curl up, you may trim the loose edges. Do not remove adhesive strips completely unless your health care provider tells you to do that. Check your port insertion site every day for signs  of infection. Check for: Redness, swelling, or pain. Fluid or blood. Warmth. Pus or a bad smell. Activity Return to your normal activities as told by your health care provider. Ask your health care provider what activities are safe for you. You may have to avoid lifting. Ask your health care provider how much you can safely lift. General instructions Take over-the-counter and prescription medicines only as told by your health care provider. Do not take baths, swim, or use a hot tub until your health care provider approves. Ask your health care provider if you may take showers. You may only be allowed to take sponge baths. If you were given a sedative during the procedure, it can affect you for several hours. Do not drive or operate machinery until your health care provider says that it is safe. Wear a medical alert bracelet in case of an emergency. This will tell any health care providers that you have a port. Keep all follow-up visits. This is important. Contact a health care provider if: You cannot flush your port with saline as directed, or you cannot draw blood from the port. You have a fever or chills. You have redness, swelling, or pain around your port insertion site. You have fluid or blood coming from your port insertion site. Your port insertion site feels warm to the touch. You have pus or a bad smell coming from the port insertion site. Get help right away if: You have chest pain or shortness of breath. You have bleeding from your port   that you cannot control. These symptoms may be an emergency. Get help right away. Call 911. Do not wait to see if the symptoms will go away. Do not drive yourself to the hospital. Summary Take care of the port as told by your health care provider. Keep the manufacturer's information card with you at all times. Change your dressing as told by your health care provider. Contact a health care provider if you have a fever or chills or if you have  redness, swelling, or pain around your port insertion site. Keep all follow-up visits. This information is not intended to replace advice given to you by your health care provider. Make sure you discuss any questions you have with your health care provider. Document Revised: 07/11/2020 Document Reviewed: 07/11/2020 Elsevier Patient Education  2023 Elsevier Inc.    Moderate Conscious Sedation, Adult, Care After This sheet gives you information about how to care for yourself after your procedure. Your health care provider may also give you more specific instructions. If you have problems or questions, contact your health care provider. What can I expect after the procedure? After the procedure, it is common to have: Sleepiness for several hours. Impaired judgment for several hours. Difficulty with balance. Vomiting if you eat too soon. Follow these instructions at home: For the time period you were told by your health care provider:  Rest. Do not participate in activities where you could fall or become injured. Do not drive or use machinery. Do not drink alcohol. Do not take sleeping pills or medicines that cause drowsiness. Do not make important decisions or sign legal documents. Do not take care of children on your own. Eating and drinking  Follow the diet recommended by your health care provider. Drink enough fluid to keep your urine pale yellow. If you vomit: Drink water, juice, or soup when you can drink without vomiting. Make sure you have little or no nausea before eating solid foods. General instructions Take over-the-counter and prescription medicines only as told by your health care provider. Have a responsible adult stay with you for the time you are told. It is important to have someone help care for you until you are awake and alert. Do not smoke. Keep all follow-up visits as told by your health care provider. This is important. Contact a health care provider if: You  are still sleepy or having trouble with balance after 24 hours. You feel light-headed. You keep feeling nauseous or you keep vomiting. You develop a rash. You have a fever. You have redness or swelling around the IV site. Get help right away if: You have trouble breathing. You have new-onset confusion at home. Summary After the procedure, it is common to feel sleepy, have impaired judgment, or feel nauseous if you eat too soon. Rest after you get home. Know the things you should not do after the procedure. Follow the diet recommended by your health care provider and drink enough fluid to keep your urine pale yellow. Get help right away if you have trouble breathing or new-onset confusion at home. This information is not intended to replace advice given to you by your health care provider. Make sure you discuss any questions you have with your health care provider. Document Revised: 05/07/2019 Document Reviewed: 12/03/2018 Elsevier Patient Education  2023 Elsevier Inc.  

## 2023-05-16 NOTE — Progress Notes (Signed)
 Patient presented to [facility] for evaluation of left arm swelling and redness that started yesterday. Per note from previous infusion RN, patient experienced burning and discomfort during Cosela  infusion.  Upon today's exam, site found to be red, edematous, and warm to touch. Site marked and measured. Site approximately 30 cm x 10.4 cm (at widest point) Rosalind Columbia, PA-C aware - assessing patient in clinic.

## 2023-05-16 NOTE — Progress Notes (Signed)
 1530 Ice pack sent home to use as needed to left uper neck and left upper chest.

## 2023-05-16 NOTE — Procedures (Signed)
 Interventional Radiology Procedure Note  Procedure: Placement of a LEFT Bard ClearVue powerport.  Catheter tip in the upper right atrium and ready for use.   Complications: None  Estimated Blood Loss: None  Recommendations: - DC home   Signed,  Roxie Cord, MD

## 2023-05-16 NOTE — Telephone Encounter (Signed)
 Notified Patient of completion of  work accommodation/clearance form. Form placed for pickup as requested by patient. No other needs or concerns noted at this time.

## 2023-05-16 NOTE — H&P (Signed)
 Chief Complaint: Patient was seen in consultation today for small cell lung cancer  at the request of Heilingoetter,Cassandra L  Referring Physician(s): Heilingoetter,Cassandra L  Supervising Physician: Fernando Hoyer  Patient Status: [facility] - Out-pt  Patient wishes to be considered Full Code for today's procedure.   History of Present Illness: Eric Foley is a 53 y.o. male with history of COPD and small cell lung cancer. Patient is currently followed by Dr. Marguerita Shih and Cassandra Heilingoetter PA-C with medical oncology. Current therapy includes radiation of the right lung and chemotherapy. Patient had a CT angio chest 03/19/23, which the impression is below.    Patient presents today for a port placement to assist with chemotherapy. Patient denies: chest pain, worsening shortness of breath, cough, abdominal pain, nausea, vomiting, diarrhea, and/or fever.   Past Medical History:  Diagnosis Date   COPD (chronic obstructive pulmonary disease) (HCC)    History of kidney stones     Past Surgical History:  Procedure Laterality Date   BRONCHIAL BIOPSY  04/22/2023   Procedure: BRONCHOSCOPY, WITH BIOPSY;  Surgeon: Denson Flake, MD;  Location: [facility] ENDOSCOPY;  Service: Pulmonary;;   BRONCHIAL BRUSHINGS  04/22/2023   Procedure: BRONCHOSCOPY, WITH BRUSH BIOPSY;  Surgeon: Denson Flake, MD;  Location: [facility] ENDOSCOPY;  Service: Pulmonary;;   FLEXIBLE BRONCHOSCOPY  04/22/2023   Procedure: Cordella Deter;  Surgeon: Denson Flake, MD;  Location: [facility] ENDOSCOPY;  Service: Pulmonary;;   HEMOSTASIS CLIP PLACEMENT  04/22/2023   Procedure: CONTROL OF HEMORRHAGE, lung;  Surgeon: Denson Flake, MD;  Location: [facility] ENDOSCOPY;  Service: Pulmonary;;   TONSILLECTOMY      Allergies: Penicillins, Amoxicillin, and Ampicillin-sulbactam sodium  Medications: Prior to Admission medications   Medication Sig Start Date End Date Taking? Authorizing Provider  Aspirin-Acetaminophen (GOODYS BODY  PAIN PO) Take by mouth 2 (two) times daily.   Yes [provider]  Potassium 99 MG TABS Take 99 mg by mouth 2 (two) times daily.   Yes [provider]  predniSONE  (DELTASONE ) 10 MG tablet Take 1 tablet (10 mg total) by mouth daily with breakfast. 04/21/23  Yes Heilingoetter, Cassandra L, PA-C  prochlorperazine  (COMPAZINE ) 10 MG tablet Take 1 tablet (10 mg total) by mouth every 6 (six) hours as needed. 05/01/23  Yes Heilingoetter, Cassandra L, PA-C  lidocaine -prilocaine  (EMLA ) cream Apply 1 Application topically as needed. 05/02/23   Heilingoetter, Cassandra L, PA-C     History reviewed. No pertinent family history.  Social History   Socioeconomic History   Marital status: Single    Spouse name: Not on file   Number of children: Not on file   Years of education: Not on file   Highest education level: Not on file  Occupational History   Not on file  Tobacco Use   Smoking status: Every Day    Types: Cigarettes   Smokeless tobacco: Never  Vaping Use   Vaping status: Never Used  Substance and Sexual Activity   Alcohol use: No   Drug use: No   Sexual activity: Not on file  Other Topics Concern   Not on file  Social History Narrative   Not on file   Social Drivers of Health   Financial Resource Strain: Not on file  Food Insecurity: Not on file  Transportation Needs: Not on file  Physical Activity: Not on file  Stress: Not on file  Social Connections: Unknown (06/04/2021)   Received from [facility]   Social Network    Social Network: Not  on file     Review of Systems: A 12 point ROS discussed and pertinent positives are indicated in the HPI above.  All other systems are negative.  Review of Systems  Constitutional:  Negative for fever.  Respiratory:  Negative for cough and shortness of breath.   Cardiovascular:  Negative for chest pain.  Gastrointestinal:  Negative for abdominal pain, diarrhea, nausea and vomiting.  Psychiatric/Behavioral:  Negative for  confusion.     Vital Signs: BP 124/81   Pulse 87   Temp 97.7 F (36.5 C) (Oral)   Resp 16   Ht 6' (1.829 m)   Wt 180 lb 4.8 oz (81.8 kg)   SpO2 97%   BMI 24.45 kg/m     Physical Exam HENT:     Head: Normocephalic and atraumatic.     Mouth/Throat:     Mouth: Mucous membranes are moist.     Pharynx: Oropharynx is clear.  Cardiovascular:     Rate and Rhythm: Normal rate and regular rhythm.  Pulmonary:     Effort: Pulmonary effort is normal.     Breath sounds: Wheezing present.  Abdominal:     General: Abdomen is flat.  Skin:    General: Skin is warm.  Neurological:     General: No focal deficit present.     Mental Status: He is alert and oriented to person, place, and time.  Psychiatric:        Mood and Affect: Mood normal.        Behavior: Behavior normal.        Thought Content: Thought content normal.     Imaging: No results found.  Labs:  CBC: Recent Labs    03/26/23 1350 04/21/23 1511 05/01/23 1031 05/12/23 0953  WBC 19.6* 5.5 5.2 4.0  HGB 14.5 15.0 13.6 13.0  HCT 45.4 46.2 40.2 38.1*  PLT 510* 230 262 242    COAGS: No results for input(s): "INR", "APTT" in the last 8760 hours.  BMP: Recent Labs    03/26/23 1350 04/21/23 1511 05/01/23 1031 05/12/23 0953  NA 139 137 140 139  K 4.6 4.6 4.2 3.9  CL 100 100 103 104  CO2 33* 29 32 29  GLUCOSE 194* 123* 134* 191*  BUN 17 21* 19 15  CALCIUM 9.8 9.2 9.7 9.4  CREATININE 0.94 0.93 0.89 0.91  GFRNONAA >60 >60 >60 >60    LIVER FUNCTION TESTS: Recent Labs    03/26/23 1350 04/21/23 1511 05/01/23 1031 05/12/23 0953  BILITOT 0.3 0.6 0.6 0.5  AST 13* 22 14* 12*  ALT 27 16 13 8   ALKPHOS 78 55 54 53  PROT 7.0 6.7 7.0 6.7  ALBUMIN 3.9 3.9 3.7 3.7    TUMOR MARKERS: No results for input(s): "AFPTM", "CEA", "CA199", "CHROMGRNA" in the last 8760 hours.  Assessment and Plan: Small cell lung cancer   Patient is a 53 y/o male with with history of COPD and small cell lung cancer. Patient  presents for a port placement to assist with chemotherapy. He has not had food or drink since 7 pm 05/15/23.  Risks and benefits of image guided port-a-catheter placement was discussed with the patient including, but not limited to bleeding, infection, pneumothorax, or fibrin sheath development and need for additional procedures.  All of the patient's questions were answered, patient is agreeable to proceed. Consent signed and in chart.    Thank you for this interesting consult.  I greatly enjoyed meeting Eric Foley and look forward to participating  in their care.  A copy of this report was sent to the requesting provider on this date.  Electronically Signed: Lawrance Presume, PA 05/16/2023, 1:37 PM   I spent a total of  30 Minutes   in face to face in clinical consultation, greater than 50% of which was counseling/coordinating care for small cell lung cancer

## 2023-05-16 NOTE — Telephone Encounter (Signed)
 TC from patient this morning reporting left arm swelling, redness and "little warmth" 2-3 inches above wrist to elbow that was noticed this morning.  Patient reports vein is "rock hard." Patients last chemotherapy was 05/14/2023. Relayed information to [facility] and appt made for 0930 today.

## 2023-05-16 NOTE — Progress Notes (Signed)
 Symptom Management Consult Note [facility]    Patient Care Team: Patient, No Pcp Per as PCP - General (General Practice) Rosalita Combe, RN as Oncology Nurse Navigator    Name / MRN / DOB: Eric Foley  [id number]  07-05-1970   Date of visit: 05/16/2023   Chief Complaint/Reason for visit: left arm pain   Current Therapy: carboplatin  for AUC of 5 from day 1, etoposide  100 Mg/M2 on days 1, 2 and 3 with Cosela  240 Mg/M2 on the days of the chemotherapy every 3 weeks for 4 cycles   Last treatment:  Day 3   Cycle 1 on 05/14/23    ASSESSMENT AND PLAN Patient is a 53 y.o. male with oncologic history of extensive stage small cell lung cancer followed by Dr. Marguerita Shih.  I have viewed most recent oncology note and lab work.  #Extensive stage small cell lung cancer  - Next appointment with oncologist is 05/21/23 for toxicity check. His injection site reaction can also be checked at that visit.   #Injection site reaction - Left forearm with 30 cm x 10 cm area of erythema. Area outlined with skin marker for monitoring purposes. Cephalic vein is hardened, still compressible. Compartments soft in left forearm and is neurovascularly intact. No open wounds. - Apply warm compresses 4-5 times daily. - Avoid sunlight exposure to the affected area. - Elevate the arm to reduce swelling. - Administer Tylenol for pain management. - Monitor for significant changes in redness or swelling.     Strict ED precautions discussed should symptoms worsen.   HEME/ONC HISTORY Oncology History  Small cell lung cancer (HCC)  05/01/2023 Initial Diagnosis   Small cell lung cancer (HCC)   05/01/2023 Cancer Staging   Staging form: Lung, AJCC V9 - Clinical: Stage IVA (cT4, cN3, cM1b) - Signed by Marlene Simas, MD on 05/01/2023   05/12/2023 -  Chemotherapy   Patient is on Treatment Plan : LUNG SMALL CELL EXTENSIVE STAGE Durvalumab + Carboplatin  D1 + Etoposide  D1-3 q21d x 4 Cycles /  Durvalumab q28d         INTERVAL HISTORY  Discussed the use of AI scribe software for clinical note transcription with the patient, who gave verbal consent to proceed.    Eric Foley is a 53 y.o. male with oncologic history as above presenting to [facility] today with chief complaint of left arm pain.  After his most recent chemotherapy infusion, he initially felt fine. However, yesterday he experienced some pain in his left arm. This morning, he noticed significant swelling in his arm, and a vein in his left forearm became 'rock hard'. Increased redness in the affected area was noted today. No soreness in the back of his arm or shoulder. The pain is described as burning and aching. He attempted to alleviate the discomfort with a hot shower, but it did not provide relief. He can still bend his arm. The pain does not radiate to his shoulder, but it is present when he moves his arm upward. He does recall burning at IV site when Cosela  was being administered during treatment.  He is currently undergoing radiation therapy and is about halfway through his treatment. He is scheduled to have a port-a cath placed today.   ROS  All other systems are reviewed and are negative for acute change except as noted in the HPI.    Allergies  Allergen Reactions   Penicillins Anaphylaxis, Rash and Swelling   Amoxicillin Rash   Ampicillin-Sulbactam  Sodium Rash     Past Medical History:  Diagnosis Date   COPD (chronic obstructive pulmonary disease) (HCC)    History of kidney stones      Past Surgical History:  Procedure Laterality Date   BRONCHIAL BIOPSY  04/22/2023   Procedure: BRONCHOSCOPY, WITH BIOPSY;  Surgeon: Denson Flake, MD;  Location: [facility] ENDOSCOPY;  Service: Pulmonary;;   BRONCHIAL BRUSHINGS  04/22/2023   Procedure: BRONCHOSCOPY, WITH BRUSH BIOPSY;  Surgeon: Denson Flake, MD;  Location: [facility] ENDOSCOPY;  Service: Pulmonary;;   FLEXIBLE BRONCHOSCOPY  04/22/2023   Procedure: Cordella Deter;  Surgeon: Denson Flake, MD;  Location: [facility] ENDOSCOPY;  Service: Pulmonary;;   HEMOSTASIS CLIP PLACEMENT  04/22/2023   Procedure: CONTROL OF HEMORRHAGE, lung;  Surgeon: Denson Flake, MD;  Location: [facility] ENDOSCOPY;  Service: Pulmonary;;   TONSILLECTOMY      Social History   Socioeconomic History   Marital status: Single    Spouse name: Not on file   Number of children: Not on file   Years of education: Not on file   Highest education level: Not on file  Occupational History   Not on file  Tobacco Use   Smoking status: Every Day    Types: Cigarettes   Smokeless tobacco: Never  Vaping Use   Vaping status: Never Used  Substance and Sexual Activity   Alcohol use: No   Drug use: No   Sexual activity: Not on file  Other Topics Concern   Not on file  Social History Narrative   Not on file   Social Drivers of Health   Financial Resource Strain: Not on file  Food Insecurity: Not on file  Transportation Needs: Not on file  Physical Activity: Not on file  Stress: Not on file  Social Connections: Unknown (06/04/2021)   Received from [facility]   Social Network    Social Network: Not on file  Intimate Partner Violence: Unknown (04/26/2021)   Received from [facility]   HITS    Physically Hurt: Not on file    Insult or Talk Down To: Not on file    Threaten Physical Harm: Not on file    Scream or Curse: Not on file    No family history on file.   Current Outpatient Medications:    Aspirin-Acetaminophen (GOODYS BODY PAIN PO), Take by mouth 2 (two) times daily., Disp: , Rfl:    lidocaine -prilocaine  (EMLA ) cream, Apply 1 Application topically as needed., Disp: 30 g, Rfl: 2   Potassium 99 MG TABS, Take 99 mg by mouth 2 (two) times daily., Disp: , Rfl:    predniSONE  (DELTASONE ) 10 MG tablet, Take 1 tablet (10 mg total) by mouth daily with breakfast., Disp: 10 tablet, Rfl: 0   prochlorperazine  (COMPAZINE ) 10 MG tablet, Take 1 tablet (10 mg total) by mouth every 6  (six) hours as needed., Disp: 30 tablet, Rfl: 2  PHYSICAL EXAM ECOG FS:1 - Symptomatic but completely ambulatory    Vitals:   05/16/23 0900  BP: 115/77  Pulse: 87  Resp: 16  Temp: 97.7 F (36.5 C)  TempSrc: Temporal  SpO2: 99%  Weight: 180 lb 4.8 oz (81.8 kg)   Physical Exam Vitals and nursing note reviewed.  Constitutional:      Appearance: He is not ill-appearing or toxic-appearing.  HENT:     Head: Normocephalic.  Eyes:     Conjunctiva/sclera: Conjunctivae normal.  Cardiovascular:     Rate and Rhythm: Normal rate.  Pulses: Normal pulses.          Radial pulses are 2+ on the left side.  Pulmonary:     Effort: Pulmonary effort is normal.  Abdominal:     General: There is no distension.  Musculoskeletal:     Cervical back: Normal range of motion.  Skin:    General: Skin is warm and dry.  Neurological:     Mental Status: He is alert.       LABORATORY DATA I have reviewed the data as listed    Latest Ref Rng & Units 05/12/2023    9:53 AM 05/01/2023   10:31 AM 04/21/2023    3:11 PM  CBC  WBC 4.0 - 10.5 K/uL 4.0  5.2  5.5   Hemoglobin 13.0 - 17.0 g/dL 40.9  81.1  91.4   Hematocrit 39.0 - 52.0 % 38.1  40.2  46.2   Platelets 150 - 400 K/uL 242  262  230         Latest Ref Rng & Units 05/12/2023    9:53 AM 05/01/2023   10:31 AM 04/21/2023    3:11 PM  CMP  Glucose 70 - 99 mg/dL 782  956  213   BUN 6 - 20 mg/dL 15  19  21    Creatinine 0.61 - 1.24 mg/dL 0.86  5.78  4.69   Sodium 135 - 145 mmol/L 139  140  137   Potassium 3.5 - 5.1 mmol/L 3.9  4.2  4.6   Chloride 98 - 111 mmol/L 104  103  100   CO2 22 - 32 mmol/L 29  32  29   Calcium 8.9 - 10.3 mg/dL 9.4  9.7  9.2   Total Protein 6.5 - 8.1 g/dL 6.7  7.0  6.7   Total Bilirubin 0.0 - 1.2 mg/dL 0.5  0.6  0.6   Alkaline Phos 38 - 126 U/L 53  54  55   AST 15 - 41 U/L 12  14  22    ALT 0 - 44 U/L 8  13  16         RADIOGRAPHIC STUDIES (from last 24 hours if applicable) I have personally reviewed the  radiological images as listed and agreed with the findings in the report. No results found.      Visit Diagnosis: 1. Injection site reaction, initial encounter   2. 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   All questions were answered. The patient knows to call the clinic with any problems, questions or concerns. No barriers to learning was detected.  A total of more than 30 minutes were spent on this encounter with face-to-face time and non-face-to-face time, including preparing to see the patient, ordering tests and/or medications, counseling the patient and coordination of care as outlined above.    Thank you for allowing me to participate in the care of this patient.    Krishon Adkison E  Walisiewicz, PA-C Department of Hematology/Oncology [facility] Phone: [phone]  [phone]    05/16/2023 10:31 AM

## 2023-05-16 NOTE — Patient Instructions (Signed)
 Vein Soreness and Swelling (Phlebitis): What to Know Phlebitis is soreness and swelling, also called inflammation, of a vein. This can happen in the arms or legs, as well as deeper inside your body. Phlebitis is usually not serious. However, it can cause serious problems when it happens deep inside your body. What are the causes? Having any of these put in the vein: A needle. An IV. A soft tube called a catheter. Getting certain medicines or fluids through an IV, such as antibiotics and cancer medicines. Having an IV for a long period of time or in a part of the body that moves a lot. A blood clot. Infection of the vein. Surgery on a vein. What increases the risk? Being overweight or obese. Pregnancy. Cancer. Reduced or slowing of blood flow through your veins. This can be caused by: Bed rest for a long time. Long-distance travel. Injury. Surgery. Heart failure. Not being active. Smoking. Taking birth control pills or hormone replacement therapy. Varicose veins. Diseases or bleeding problems that increase clotting. Taking drugs through the vein. Having had blood clots. What are the signs or symptoms? A red, tender, swollen, and painful area on your skin. Usually, the area is long and narrow, and it may spread. The affected area feeling warm to the touch. Firmness along the vein or center of the affected area. Low fever. How is this diagnosed? This condition is diagnosed based on: Your symptoms. A physical exam. Your medical history, including your family history. Tests may be done to rule out other conditions, such as blood clots. These may include: Blood tests. Ultrasound exam. Genetic tests. Biopsy, which is when a small piece of tissue is removed for testing. This is rare. How is this treated? Treatment may include: Wearing compression stockings or bandages that put pressure on the area. Medicines, such as: Anti-inflammatory medicines. Antibiotic medicines if you  have an infection. Blood-thinning medicines if you have a blood clot. Taking out an IV that may be causing the problem. Using a different medicine or IV solution that will not irritate the vein. In rare cases, surgery may be needed to remove a damaged part of a vein. Follow these instructions at home: Managing pain, stiffness, and swelling  Use heat as told. Use the heat source that your health care provider recommends, such as a moist heat pack or a heating pad. Do this as often as told. Place a towel between your skin and the heat source. Leave the heat on for 20-30 minutes. If your skin turns red, take off the heat right away to prevent burns. The risk of burns is higher if you can't feel pain, heat, or cold. Raise the affected area above the level of your heart while you're sitting or lying down. Use pillows as needed. Medicines Take your medicines only as told. If you were given antibiotics, take them as told. Do not stop taking them even if you start to feel better. If you're taking blood thinners: Take your medicines as told. Take them at the same time each day. Talk with your provider before taking any products you can buy at the store. Do not do things that could hurt or bruise you. Be careful to avoid falls. Wear an alert bracelet or carry a card that says you take blood thinners. General instructions If you have phlebitis in your legs: Avoid sitting or lying down for a long time. Keep your legs moving. Take short walks to break up long periods of sitting. Avoid bed rest that  lasts for a long time. Regular sleep is not bed rest. Wear compression stockings to reduce swelling and help prevent blood clots in your legs. Do not smoke, vape, or use nicotine or tobacco. Contact a health care provider if: You have unusual bruises. You have bleeding. Your symptoms do not get better. Your symptoms get worse. You're taking medicine to treat swelling, called anti-inflammatory  medicine, and you get pain in your belly or dark, tarry poop. Get help right away if: You have chest pain. You have trouble breathing. You have a fever and your symptoms suddenly get worse. You cough up blood. You feel dizzy or you faint. You have very bad pain and swelling in the affected arm or leg. These symptoms may be an emergency. Call 911 right away. Do not wait to see if the symptoms will go away. Do not drive yourself to the hospital. This information is not intended to replace advice given to you by your health care provider. Make sure you discuss any questions you have with your health care provider. Document Revised: 07/13/2022 Document Reviewed: 07/13/2022 Elsevier Patient Education  2024 [employer].

## 2023-05-17 NOTE — Progress Notes (Unsigned)
 [facility] OFFICE PROGRESS NOTE  Patient, No Pcp Per No address on file  DIAGNOSIS:  Extensive stage Small cell lung cancer.  He presented with a large right upper lobe lung mass with mediastinal invasion, right pleural/subpleural involvement, left supraclavicular adenopathy, and possible pelvic peritoneal nodule. He was diagnosed in March 2025.   PRIOR THERAPY: None  CURRENT THERAPY: 1) radiation to the large right lung mass under the care of Dr. Lorri Rota last dose on 06/06/2023  2) palliative systemic chemotherapy with carboplatin  for an AUC of 5 and etoposide  100 mg/m on days 1, 2, and 3 IV every 3 weeks.  We will add Neulasta and Imfinzi once he completes his radiation to the chest. First dose on 05/12/23  INTERVAL HISTORY: Eric Foley 53 y.o. male returns to the clinic today for follow-up visit.  The patient was last seen in the clinic by Dr. Marguerita Shih and myself on 05/01/23.  At that point in time, we discussed his treatment option which includes chemotherapy/immunotherapy.  After some reconsideration the patient did decide to pursue treatment.  He underwent his first cycle of treatment last week and he tolerated it fairly well overall except mild nausea which was managed with his anti-emetic. No vomiting occurred, and he only required one pill to alleviate the nausea. He was seen in the symptom management clinic last week due to some erythema over his IV site and swelling. The swelling has improved as well as the firmness. It is still erythematous but much better than prior. He is using hot compresses He also had a Port-A-Cath placed in the interval which he tolerated well.   The patient is very adamant on continuing to work.  He previously declined seeing Child psychotherapist.  He reports a decrease in appetite, particularly when not on Decadron , which he receives during treatments. He has been consuming two Ensure drinks daily to maintain his nutrition, one in the morning and one  in the evening. He notes a weight loss from 180 pounds and attributes fluctuations in appetite to the effects of Decadron . He was previously on prednisone  but we previously discussed not being on long term steroid use.   He saw nutrition and submitted work accomondation forms, which he will pick up today.   No fevers, chills, night sweats, vomiting, diarrhea, constipation, headaches, vision changes, rashes, or hemoptysis. He reports improved breathing and increased energy levels as the swelling in his face and upper extremities decreases.  He has a history of smoking for 41 years and is attempting to quit. Boredom at home contributes to his smoking habit, especially when not working. He is currently on light duty at work and is eager to return to regular duty to avoid financial strain.  He is here today for evaluation and repeat blood work for toxicity check.     MEDICAL HISTORY: Past Medical History:  Diagnosis Date   COPD (chronic obstructive pulmonary disease) (HCC)    History of kidney stones     ALLERGIES:  is allergic to penicillins, amoxicillin, and ampicillin-sulbactam sodium.  MEDICATIONS:  Current Outpatient Medications  Medication Sig Dispense Refill   Aspirin-Acetaminophen (GOODYS BODY PAIN PO) Take by mouth 2 (two) times daily.     lidocaine -prilocaine  (EMLA ) cream Apply 1 Application topically as needed. 30 g 2   Potassium 99 MG TABS Take 99 mg by mouth 2 (two) times daily.     predniSONE  (DELTASONE ) 10 MG tablet Take 1 tablet (10 mg total) by mouth daily with breakfast. 10  tablet 0   prochlorperazine  (COMPAZINE ) 10 MG tablet Take 1 tablet (10 mg total) by mouth every 6 (six) hours as needed. 30 tablet 2   No current facility-administered medications for this visit.    SURGICAL HISTORY:  Past Surgical History:  Procedure Laterality Date   BRONCHIAL BIOPSY  04/22/2023   Procedure: BRONCHOSCOPY, WITH BIOPSY;  Surgeon: Denson Flake, MD;  Location: [facility] ENDOSCOPY;   Service: Pulmonary;;   BRONCHIAL BRUSHINGS  04/22/2023   Procedure: BRONCHOSCOPY, WITH BRUSH BIOPSY;  Surgeon: Denson Flake, MD;  Location: [facility] ENDOSCOPY;  Service: Pulmonary;;   FLEXIBLE BRONCHOSCOPY  04/22/2023   Procedure: Cordella Deter;  Surgeon: Denson Flake, MD;  Location: [facility] ENDOSCOPY;  Service: Pulmonary;;   HEMOSTASIS CLIP PLACEMENT  04/22/2023   Procedure: CONTROL OF HEMORRHAGE, lung;  Surgeon: Denson Flake, MD;  Location: [facility] ENDOSCOPY;  Service: Pulmonary;;   IR IMAGING GUIDED PORT INSERTION  05/16/2023   TONSILLECTOMY      REVIEW OF SYSTEMS:   Review of Systems  Constitutional: Decreased appetite and weight loss. Improved energy. Negative for chills and fever.  HENT: Negative for mouth sores, nosebleeds, sore throat and trouble swallowing.   Eyes: Negative for eye problems and icterus.  Respiratory: Improved shortness of breath. Baseline "normal" cough. Negative for hemoptysis and wheezing.   Cardiovascular: Negative for chest pain and leg swelling.  Gastrointestinal: Mild nausea following treatment. Negative for abdominal pain, constipation, diarrhea,  and vomiting.  Genitourinary: Negative for bladder incontinence, difficulty urinating, dysuria, frequency and hematuria.   Musculoskeletal: Negative for back pain, gait problem, neck pain and neck stiffness.  Skin: Persistent but improved erythema and swelling over left upper extremity.  Neurological: Negative for dizziness, extremity weakness, gait problem, headaches, light-headedness and seizures.  Hematological: Negative for adenopathy. Does not bruise/bleed easily.  Psychiatric/Behavioral: Negative for confusion, depression and sleep disturbance. The patient is not nervous/anxious.     PHYSICAL EXAMINATION:  There were no vitals taken for this visit.  ECOG PERFORMANCE STATUS: 1  Physical Exam  Constitutional: Oriented to person, place, and time and well-developed, well-nourished, and in no distress.   HENT:   Head: Normocephalic and atraumatic.  Mouth/Throat: Oropharynx is clear and moist. No oropharyngeal exudate.  Eyes: Conjunctivae are normal. Right eye exhibits no discharge. Left eye exhibits no discharge. No scleral icterus.  Neck: Normal range of motion. Neck supple.  Cardiovascular: Normal rate, regular rhythm, normal heart sounds and intact distal pulses.   Pulmonary/Chest: Effort normal and breath sounds normal. No respiratory distress. No wheezes. No rales.  Abdominal: Soft. Bowel sounds are normal. Exhibits no distension and no mass. There is no tenderness.  Musculoskeletal: Normal range of motion. Exhibits no edema.  Lymphadenopathy:    No cervical adenopathy.  Neurological: Alert and oriented to person, place, and time. Exhibits normal muscle tone. Gait normal. Coordination normal.  Skin: Skin is warm and dry. Improved but persistent erythema over left upper extremity (improved from prior). Not diaphoretic. No erythema. No pallor.  Psychiatric: Mood, memory and judgment normal.  Vitals reviewed.  LABORATORY DATA: Lab Results  Component Value Date   WBC 4.0 05/12/2023   HGB 13.0 05/12/2023   HCT 38.1 (L) 05/12/2023   MCV 89.2 05/12/2023   PLT 242 05/12/2023      Chemistry      Component Value Date/Time   NA 139 05/12/2023 0953   K 3.9 05/12/2023 0953   CL 104 05/12/2023 0953   CO2 29 05/12/2023 0953   BUN 15 05/12/2023  1191   CREATININE 0.91 05/12/2023 0953      Component Value Date/Time   CALCIUM 9.4 05/12/2023 0953   ALKPHOS 53 05/12/2023 0953   AST 12 (L) 05/12/2023 0953   ALT 8 05/12/2023 0953   BILITOT 0.5 05/12/2023 0953       RADIOGRAPHIC STUDIES:  IR IMAGING GUIDED PORT INSERTION Result Date: 05/16/2023 INDICATION: 53 year old male with right upper lobe lung cancer. He presents for left-sided port catheter placement. EXAM: IMPLANTED PORT A CATH PLACEMENT WITH ULTRASOUND AND FLUOROSCOPIC GUIDANCE MEDICATIONS: None. ANESTHESIA/SEDATION: Versed  2 mg IV;  Fentanyl  100 mcg IV; administered by the radiology nurse Moderate Sedation Time:  24 minutes The patient's vital signs and level of consciousness were continuously monitored during the procedure by the interventional radiology nurse under my direct supervision. FLUOROSCOPY: Radiation exposure index: Less than 1 mGy COMPLICATIONS: None immediate. PROCEDURE: The left neck and chest was prepped with chlorhexidine , and draped in the usual sterile fashion using maximum barrier technique (cap and mask, sterile gown, sterile gloves, large sterile sheet, hand hygiene and cutaneous antiseptic). Local anesthesia was attained by infiltration with 1% lidocaine  with epinephrine . Ultrasound demonstrated patency of the left internal jugular vein vein, and this was documented with an image. Under real-time ultrasound guidance, this vein was accessed with a 21 gauge micropuncture needle and image documentation was performed. A small dermatotomy was made at the access site with an 11 scalpel. A 0.018" wire was advanced into the SVC and the access needle exchanged for a 66F micropuncture vascular sheath. The 0.018" wire was then removed and a 0.035" wire advanced into the IVC. An appropriate location for the subcutaneous reservoir was selected below the clavicle and an incision was made through the skin and underlying soft tissues. The subcutaneous tissues were then dissected using a combination of blunt and sharp surgical technique and a pocket was formed. A Bard Clear Vue single lumen power injectable portacatheter was then tunneled through the subcutaneous tissues from the pocket to the dermatotomy and the port reservoir placed within the subcutaneous pocket. The venous access site was then serially dilated and a peel away vascular sheath placed over the wire. The wire was removed and the port catheter advanced into position under fluoroscopic guidance. The catheter tip is positioned in the superior cavoatrial junction. This was  documented with a spot image. The portacatheter was then tested and found to flush and aspirate well. The port was flushed with saline followed by 100 units/mL heparinized saline. The pocket was then closed in two layers using first subdermal inverted interrupted absorbable sutures followed by a running subcuticular suture. The epidermis was then sealed with Dermabond. The dermatotomy at the venous access site was also closed with Dermabond. IMPRESSION: Successful placement of a left IJ approach Bard Clear Vue power injectable port catheter with ultrasound and fluoroscopic guidance. The catheter is ready for use. Electronically Signed   By: Fernando Hoyer M.D.   On: 05/16/2023 15:12     ASSESSMENT/PLAN:  This is a very pleasant 53 year old Caucasian male diagnosed with at least stage IIIC Small cell lung cancer (T4, N3, M0).  He presented with a large right upper lobe lung mass with mediastinal invasion, right pleural/subpleural involvement, left supraclavicular adenopathy, and possible pelvic peritoneal nodule.  He was diagnosed in March 2025.   The patient is currently undergoing radiation to the large right upper lung mass/CBC syndrome in the last day radiation is tentatively scheduled for 06/06/2023.   He started palliative  systemic chemotherapy with carboplatin   for an AUC of 5 day 1 and etoposide  100 mg/m on day 1, 2, and 3 on 05/12/23. He is status post 1 cycle. We will add neulasta and immunotherapy once he completes radiation.   He had a port-a-cath placed in the interval.   Neutropenia due to chemotherapy Mild neutropenia with neutrophil count of 1.4, secondary to chemotherapy. No infection signs. Injections on hold until post-radiation. - Monitor neutrophil count weekly. - Educated on infection prevention: wash fruits and vegetables, avoid sick contacts, mask, and practice hand hygiene. - Report any signs of infection immediately.  Nausea due to chemotherapy Mild nausea managed with  antiemetics. No vomiting. Symptoms resolved with medication. - Continue antiemetics as needed.  Loss of appetite Decreased appetite post-chemotherapy, improved with Decadron . Prednisone  avoided. Discussed alternative stimulants, which he is not interested. Consuming Ensure for nutrition. - Continue two Ensure supplements daily. - Increase snacking to boost caloric intake. - Follow up with nutritionist on May 12.  Swelling of upper extremity Significant reduction in swelling with hot compresses and port placement. 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 - Use hot compresses as needed. - Monitor for increased swelling or redness.  Smoking Long-term smoker attempting cessation. Increased smoking due to boredom. Discussed reduction strategies. - Encourage activities to reduce boredom. - Consider substitutes or sucking hard candy canes for cravings.   Will see him back for follow-up visit in 2 weeks before undergoing cycle #2.  The patient was advised to call immediately if he has any concerning symptoms in the interval. The patient voices understanding of current disease status and treatment options and is in agreement with the current care plan. All questions were answered. The patient knows to call the clinic with any problems, questions or concerns. We can certainly see the patient much sooner if necessary      No orders of the defined types were placed in this encounter.   The total time spent in the appointment was 20-29 minutes  Kylea Berrong L Khadeejah Castner, PA-C 05/17/23

## 2023-05-17 NOTE — Progress Notes (Signed)
  Radiation Oncology         [phone]) [phone] ________________________________  Name: Eric Foley MRN: [id number]  Date: 05/16/2023  DOB: 07-28-1970  SIMULATION NOTE  NARRATIVE:  Due to significant reduction in the size of the patient's tumor, he underwent simulation today for ongoing radiation therapy.  His original plan no longer is conformal to the smaller targets.  A new CT study set was obtained for the purpose of treatment planning.  The target and avoidance structures were contoured.  Treatment planning then occurred.  The radiation boost prescription was entered and confirmed.  Multiple complex treatment devices were fabricated in the form of multi-leaf collimators to shape radiation around the targets while maximally excluding nearby normal structures.   PLAN:  This modified radiation beam arrangement is intended to continue the current radiation dose.  ------------------------------------------------  Trilby Fujisawa. Lorri Rota, M.D.

## 2023-05-19 ENCOUNTER — Other Ambulatory Visit

## 2023-05-19 ENCOUNTER — Ambulatory Visit: Payer: PRIVATE HEALTH INSURANCE

## 2023-05-19 DIAGNOSIS — Z5111 Encounter for antineoplastic chemotherapy: Secondary | ICD-10-CM | POA: Diagnosis not present

## 2023-05-20 ENCOUNTER — Ambulatory Visit: Payer: PRIVATE HEALTH INSURANCE

## 2023-05-21 ENCOUNTER — Ambulatory Visit
Admission: RE | Admit: 2023-05-21 | Discharge: 2023-05-21 | Disposition: A | Discharge status: Home / Self Care | Payer: PRIVATE HEALTH INSURANCE | Source: Ambulatory Visit | Attending: Radiation Oncology | Admitting: Radiation Oncology

## 2023-05-21 ENCOUNTER — Inpatient Hospital Stay: Payer: PRIVATE HEALTH INSURANCE

## 2023-05-21 ENCOUNTER — Other Ambulatory Visit: Payer: Self-pay

## 2023-05-21 ENCOUNTER — Inpatient Hospital Stay (HOSPITAL_BASED_OUTPATIENT_CLINIC_OR_DEPARTMENT_OTHER): Payer: PRIVATE HEALTH INSURANCE | Admitting: Physician Assistant

## 2023-05-21 VITALS — BP 115/69 | HR 100 | Temp 98.2°F | Resp 16 | Wt 174.2 lb

## 2023-05-21 DIAGNOSIS — Z95828 Presence of other vascular implants and grafts: Secondary | ICD-10-CM | POA: Insufficient documentation

## 2023-05-21 DIAGNOSIS — Z5111 Encounter for antineoplastic chemotherapy: Secondary | ICD-10-CM | POA: Diagnosis not present

## 2023-05-21 DIAGNOSIS — C3411 Malignant neoplasm of upper lobe, right bronchus or lung: Secondary | ICD-10-CM | POA: Diagnosis not present

## 2023-05-21 DIAGNOSIS — C3490 Malignant neoplasm of unspecified part of unspecified bronchus or lung: Secondary | ICD-10-CM

## 2023-05-21 LAB — CBC WITH DIFFERENTIAL (CANCER CENTER ONLY)
Abs Immature Granulocytes: 0.02 10*3/uL (ref 0.00–0.07)
Basophils Absolute: 0 10*3/uL (ref 0.0–0.1)
Basophils Relative: 1 %
Eosinophils Absolute: 0 10*3/uL (ref 0.0–0.5)
Eosinophils Relative: 2 %
HCT: 33.6 % — ABNORMAL LOW (ref 39.0–52.0)
Hemoglobin: 11.7 g/dL — ABNORMAL LOW (ref 13.0–17.0)
Immature Granulocytes: 1 %
Lymphocytes Relative: 9 %
Lymphs Abs: 0.2 10*3/uL — ABNORMAL LOW (ref 0.7–4.0)
MCH: 30.5 pg (ref 26.0–34.0)
MCHC: 34.8 g/dL (ref 30.0–36.0)
MCV: 87.5 fL (ref 80.0–100.0)
Monocytes Absolute: 0.2 10*3/uL (ref 0.1–1.0)
Monocytes Relative: 9 %
Neutro Abs: 1.4 10*3/uL — ABNORMAL LOW (ref 1.7–7.7)
Neutrophils Relative %: 78 %
Platelet Count: 132 10*3/uL — ABNORMAL LOW (ref 150–400)
RBC: 3.84 MIL/uL — ABNORMAL LOW (ref 4.22–5.81)
RDW: 14.9 % (ref 11.5–15.5)
WBC Count: 1.8 10*3/uL — ABNORMAL LOW (ref 4.0–10.5)
nRBC: 0 % (ref 0.0–0.2)

## 2023-05-21 LAB — CMP (CANCER CENTER ONLY)
ALT: 12 U/L (ref 0–44)
AST: 12 U/L — ABNORMAL LOW (ref 15–41)
Albumin: 3.9 g/dL (ref 3.5–5.0)
Alkaline Phosphatase: 65 U/L (ref 38–126)
Anion gap: 4 — ABNORMAL LOW (ref 5–15)
BUN: 19 mg/dL (ref 6–20)
CO2: 29 mmol/L (ref 22–32)
Calcium: 9 mg/dL (ref 8.9–10.3)
Chloride: 103 mmol/L (ref 98–111)
Creatinine: 0.78 mg/dL (ref 0.61–1.24)
GFR, Estimated: >60 mL/min (ref >60)
Glucose, Bld: 186 mg/dL — ABNORMAL HIGH (ref 70–99)
Potassium: 4.1 mmol/L (ref 3.5–5.1)
Sodium: 136 mmol/L (ref 135–145)
Total Bilirubin: 0.5 mg/dL (ref 0.0–1.2)
Total Protein: 6.5 g/dL (ref 6.5–8.1)

## 2023-05-21 LAB — RAD ONC ARIA SESSION SUMMARY
Course Elapsed Days: 35
Plan Fractions Treated to Date: 1
Plan Prescribed Dose Per Fraction: 2 Gy
Plan Total Fractions Prescribed: 1
Plan Total Prescribed Dose: 2 Gy
Reference Point Dosage Given to Date: 36 Gy
Reference Point Session Dosage Given: 2 Gy
Session Number: 18

## 2023-05-21 MED ORDER — HEPARIN SOD (PORK) LOCK FLUSH 100 UNIT/ML IV SOLN
500.0000 [IU] | Freq: Once | INTRAVENOUS | Status: AC
  Administered 2023-05-21: 500 [IU]

## 2023-05-21 MED ORDER — SODIUM CHLORIDE 0.9% FLUSH
10.0000 mL | Freq: Once | INTRAVENOUS | Status: AC
  Administered 2023-05-21: 10 mL

## 2023-05-22 ENCOUNTER — Other Ambulatory Visit: Payer: Self-pay

## 2023-05-22 ENCOUNTER — Ambulatory Visit: Payer: PRIVATE HEALTH INSURANCE

## 2023-05-22 ENCOUNTER — Ambulatory Visit
Admission: RE | Admit: 2023-05-22 | Discharge: 2023-05-22 | Disposition: A | Discharge status: Home / Self Care | Payer: PRIVATE HEALTH INSURANCE | Source: Ambulatory Visit | Attending: Radiation Oncology | Admitting: Radiation Oncology

## 2023-05-22 DIAGNOSIS — Z510 Encounter for antineoplastic radiation therapy: Secondary | ICD-10-CM | POA: Diagnosis present

## 2023-05-22 DIAGNOSIS — R110 Nausea: Secondary | ICD-10-CM | POA: Diagnosis not present

## 2023-05-22 DIAGNOSIS — Z452 Encounter for adjustment and management of vascular access device: Secondary | ICD-10-CM | POA: Insufficient documentation

## 2023-05-22 DIAGNOSIS — Z5111 Encounter for antineoplastic chemotherapy: Secondary | ICD-10-CM | POA: Insufficient documentation

## 2023-05-22 DIAGNOSIS — D701 Agranulocytosis secondary to cancer chemotherapy: Secondary | ICD-10-CM | POA: Diagnosis not present

## 2023-05-22 DIAGNOSIS — R630 Anorexia: Secondary | ICD-10-CM | POA: Insufficient documentation

## 2023-05-22 DIAGNOSIS — C3411 Malignant neoplasm of upper lobe, right bronchus or lung: Secondary | ICD-10-CM | POA: Insufficient documentation

## 2023-05-22 LAB — RAD ONC ARIA SESSION SUMMARY
Course Elapsed Days: 36
Plan Fractions Treated to Date: 1
Plan Prescribed Dose Per Fraction: 2 Gy
Plan Total Fractions Prescribed: 17
Plan Total Prescribed Dose: 34 Gy
Reference Point Dosage Given to Date: 38 Gy
Reference Point Session Dosage Given: 2 Gy
Session Number: 19

## 2023-05-23 ENCOUNTER — Ambulatory Visit: Payer: PRIVATE HEALTH INSURANCE

## 2023-05-23 NOTE — Progress Notes (Signed)
 Pt called me and asked if it would be okay if he could take Claritin as his allergies are very bad at the moment. I told the pt I would speak to C.Heilingoetter, Onc PA first and get back to him. After conferring with C.Heilingoetter, Onc PA via , I called the pt back to let him know it is fine if he takes Claritin 1 tab once daily. Pt appreciated information provided. No other questions at this time.

## 2023-05-26 ENCOUNTER — Ambulatory Visit
Admission: RE | Admit: 2023-05-26 | Discharge: 2023-05-26 | Disposition: A | Discharge status: Home / Self Care | Payer: PRIVATE HEALTH INSURANCE | Source: Ambulatory Visit | Attending: Radiation Oncology

## 2023-05-26 ENCOUNTER — Inpatient Hospital Stay: Payer: PRIVATE HEALTH INSURANCE | Attending: Internal Medicine

## 2023-05-26 ENCOUNTER — Other Ambulatory Visit: Payer: Self-pay

## 2023-05-26 DIAGNOSIS — C3490 Malignant neoplasm of unspecified part of unspecified bronchus or lung: Secondary | ICD-10-CM

## 2023-05-26 DIAGNOSIS — Z5111 Encounter for antineoplastic chemotherapy: Secondary | ICD-10-CM | POA: Diagnosis not present

## 2023-05-26 DIAGNOSIS — Z95828 Presence of other vascular implants and grafts: Secondary | ICD-10-CM

## 2023-05-26 LAB — RAD ONC ARIA SESSION SUMMARY
Course Elapsed Days: 40
Plan Fractions Treated to Date: 2
Plan Prescribed Dose Per Fraction: 2 Gy
Plan Total Fractions Prescribed: 17
Plan Total Prescribed Dose: 34 Gy
Reference Point Dosage Given to Date: 40 Gy
Reference Point Session Dosage Given: 2 Gy
Session Number: 20

## 2023-05-26 LAB — CBC WITH DIFFERENTIAL (CANCER CENTER ONLY)
Abs Immature Granulocytes: 0.01 10*3/uL (ref 0.00–0.07)
Basophils Absolute: 0 10*3/uL (ref 0.0–0.1)
Basophils Relative: 1 %
Eosinophils Absolute: 0 10*3/uL (ref 0.0–0.5)
Eosinophils Relative: 2 %
HCT: 28.5 % — ABNORMAL LOW (ref 39.0–52.0)
Hemoglobin: 10 g/dL — ABNORMAL LOW (ref 13.0–17.0)
Immature Granulocytes: 1 %
Lymphocytes Relative: 16 %
Lymphs Abs: 0.2 10*3/uL — ABNORMAL LOW (ref 0.7–4.0)
MCH: 31 pg (ref 26.0–34.0)
MCHC: 35.1 g/dL (ref 30.0–36.0)
MCV: 88.2 fL (ref 80.0–100.0)
Monocytes Absolute: 0.4 10*3/uL (ref 0.1–1.0)
Monocytes Relative: 30 %
Neutro Abs: 0.7 10*3/uL — ABNORMAL LOW (ref 1.7–7.7)
Neutrophils Relative %: 50 %
Platelet Count: 117 10*3/uL — ABNORMAL LOW (ref 150–400)
RBC: 3.23 MIL/uL — ABNORMAL LOW (ref 4.22–5.81)
RDW: 16.4 % — ABNORMAL HIGH (ref 11.5–15.5)
WBC Count: 1.4 10*3/uL — ABNORMAL LOW (ref 4.0–10.5)
nRBC: 0 % (ref 0.0–0.2)

## 2023-05-26 LAB — CMP (CANCER CENTER ONLY)
ALT: 10 U/L (ref 0–44)
AST: 12 U/L — ABNORMAL LOW (ref 15–41)
Albumin: 3.9 g/dL (ref 3.5–5.0)
Alkaline Phosphatase: 61 U/L (ref 38–126)
Anion gap: 6 (ref 5–15)
BUN: 16 mg/dL (ref 6–20)
CO2: 28 mmol/L (ref 22–32)
Calcium: 9.1 mg/dL (ref 8.9–10.3)
Chloride: 105 mmol/L (ref 98–111)
Creatinine: 0.87 mg/dL (ref 0.61–1.24)
GFR, Estimated: >60 mL/min (ref >60)
Glucose, Bld: 86 mg/dL (ref 70–99)
Potassium: 3.6 mmol/L (ref 3.5–5.1)
Sodium: 139 mmol/L (ref 135–145)
Total Bilirubin: 0.4 mg/dL (ref 0.0–1.2)
Total Protein: 6.5 g/dL (ref 6.5–8.1)

## 2023-05-26 MED ORDER — SODIUM CHLORIDE 0.9% FLUSH
10.0000 mL | Freq: Once | INTRAVENOUS | Status: AC
Start: 2023-05-26 — End: 2023-05-26
  Administered 2023-05-26: 10 mL

## 2023-05-26 MED ORDER — HEPARIN SOD (PORK) LOCK FLUSH 100 UNIT/ML IV SOLN
500.0000 [IU] | Freq: Once | INTRAVENOUS | Status: AC
Start: 2023-05-26 — End: 2023-05-26
  Administered 2023-05-26: 500 [IU]

## 2023-05-27 ENCOUNTER — Other Ambulatory Visit: Payer: Self-pay

## 2023-05-27 ENCOUNTER — Telehealth: Payer: Self-pay

## 2023-05-27 ENCOUNTER — Ambulatory Visit
Admission: RE | Admit: 2023-05-27 | Discharge: 2023-05-27 | Disposition: A | Discharge status: Home / Self Care | Payer: PRIVATE HEALTH INSURANCE | Source: Ambulatory Visit | Attending: Radiation Oncology | Admitting: Radiation Oncology

## 2023-05-27 DIAGNOSIS — Z5111 Encounter for antineoplastic chemotherapy: Secondary | ICD-10-CM | POA: Diagnosis not present

## 2023-05-27 LAB — RAD ONC ARIA SESSION SUMMARY
Course Elapsed Days: 41
Plan Fractions Treated to Date: 3
Plan Prescribed Dose Per Fraction: 2 Gy
Plan Total Fractions Prescribed: 17
Plan Total Prescribed Dose: 34 Gy
Reference Point Dosage Given to Date: 42 Gy
Reference Point Session Dosage Given: 2 Gy
Session Number: 21

## 2023-05-27 NOTE — Telephone Encounter (Signed)
 Spoke with patient in regards to lab results.  Per Cassie, PA- patients WBC is low.  Reviewed neutropenic precautions with patient.  He verbalized understanding.

## 2023-05-28 ENCOUNTER — Ambulatory Visit: Payer: PRIVATE HEALTH INSURANCE

## 2023-05-29 ENCOUNTER — Ambulatory Visit: Payer: PRIVATE HEALTH INSURANCE

## 2023-05-29 ENCOUNTER — Other Ambulatory Visit: Payer: Self-pay

## 2023-05-30 ENCOUNTER — Ambulatory Visit
Admission: RE | Admit: 2023-05-30 | Discharge: 2023-05-30 | Disposition: A | Discharge status: Home / Self Care | Payer: PRIVATE HEALTH INSURANCE | Source: Ambulatory Visit | Attending: Radiation Oncology | Admitting: Radiation Oncology

## 2023-05-30 ENCOUNTER — Other Ambulatory Visit: Payer: Self-pay

## 2023-05-30 ENCOUNTER — Ambulatory Visit: Payer: PRIVATE HEALTH INSURANCE

## 2023-05-30 DIAGNOSIS — Z5111 Encounter for antineoplastic chemotherapy: Secondary | ICD-10-CM | POA: Diagnosis not present

## 2023-05-30 DIAGNOSIS — C3411 Malignant neoplasm of upper lobe, right bronchus or lung: Secondary | ICD-10-CM

## 2023-05-30 LAB — RAD ONC ARIA SESSION SUMMARY
Course Elapsed Days: 44
Plan Fractions Treated to Date: 4
Plan Prescribed Dose Per Fraction: 2 Gy
Plan Total Fractions Prescribed: 17
Plan Total Prescribed Dose: 34 Gy
Reference Point Dosage Given to Date: 44 Gy
Reference Point Session Dosage Given: 2 Gy
Session Number: 22

## 2023-05-30 MED FILL — Fosaprepitant Dimeglumine For IV Infusion 150 MG (Base Eq): INTRAVENOUS | Qty: 5 | Status: AC

## 2023-05-31 NOTE — Progress Notes (Unsigned)
 [facility] OFFICE PROGRESS NOTE  Patient, No Pcp Per No address on file  DIAGNOSIS: Extensive stage Small cell lung cancer.  He presented with a large right upper lobe lung mass with mediastinal invasion, right pleural/subpleural involvement, left supraclavicular adenopathy, and possible pelvic peritoneal nodule. He was diagnosed in March 2025.   PRIOR THERAPY: None  CURRENT THERAPY: 1) radiation to the large right lung mass under the care of Dr. Lorri Rota last dose on 06/06/2023  2) palliative systemic chemotherapy with carboplatin  for an AUC of 5 and etoposide  100 mg/m on days 1, 2, and 3 IV every 3 weeks.  We will add Neulasta and Imfinzi once he completes his radiation to the chest. First dose on 05/12/23  INTERVAL HISTORY: Eric Foley 53 y.o. male returns to the clinic today for follow-up visit.  The patient was last seen in the clinic by myself for his one week follow up visit after his first cycle of chemotherapy. He tolerated it well. except mild nausea which was managed with his anti-emetic. No vomiting occurred, and he only required one pill to alleviate the nausea.   He reports a decrease in appetite, particularly when not on Decadron , which he receives during treatments. He has been consuming two Ensure drinks daily to maintain his nutrition, one in the morning and one in the evening. He notes a weight loss from 180 pounds and attributes fluctuations in appetite to the effects of Decadron . He was previously on prednisone  but we previously discussed not being on long term steroid use.   He saw nutrition and submitted work accomondation forms. The patient is very adament on continuing to work.   No fevers, chills, night sweats, vomiting, diarrhea, constipation, headaches, vision changes, rashes, or hemoptysis. He reports improved breathing and increased energy levels as the swelling in his face and upper extremities decreases. He is here for evaluation and repeat blood  work before undergoing cycle #2.      MEDICAL HISTORY: Past Medical History:  Diagnosis Date   COPD (chronic obstructive pulmonary disease) (HCC)    History of kidney stones     ALLERGIES:  is allergic to penicillins, amoxicillin, and ampicillin-sulbactam sodium.  MEDICATIONS:  Current Outpatient Medications  Medication Sig Dispense Refill   Aspirin-Acetaminophen (GOODYS BODY PAIN PO) Take by mouth 2 (two) times daily.     lidocaine -prilocaine  (EMLA ) cream Apply 1 Application topically as needed. 30 g 2   Potassium 99 MG TABS Take 99 mg by mouth 2 (two) times daily.     prochlorperazine  (COMPAZINE ) 10 MG tablet Take 1 tablet (10 mg total) by mouth every 6 (six) hours as needed. 30 tablet 2   No current facility-administered medications for this visit.    SURGICAL HISTORY:  Past Surgical History:  Procedure Laterality Date   BRONCHIAL BIOPSY  04/22/2023   Procedure: BRONCHOSCOPY, WITH BIOPSY;  Surgeon: Denson Flake, MD;  Location: [facility] ENDOSCOPY;  Service: Pulmonary;;   BRONCHIAL BRUSHINGS  04/22/2023   Procedure: BRONCHOSCOPY, WITH BRUSH BIOPSY;  Surgeon: Denson Flake, MD;  Location: [facility] ENDOSCOPY;  Service: Pulmonary;;   FLEXIBLE BRONCHOSCOPY  04/22/2023   Procedure: Cordella Deter;  Surgeon: Denson Flake, MD;  Location: [facility] ENDOSCOPY;  Service: Pulmonary;;   HEMOSTASIS CLIP PLACEMENT  04/22/2023   Procedure: CONTROL OF HEMORRHAGE, lung;  Surgeon: Denson Flake, MD;  Location: [facility] ENDOSCOPY;  Service: Pulmonary;;   IR IMAGING GUIDED PORT INSERTION  05/16/2023   TONSILLECTOMY      REVIEW OF  SYSTEMS:   Review of Systems  Constitutional: Negative for appetite change, chills, fatigue, fever and unexpected weight change.  HENT:   Negative for mouth sores, nosebleeds, sore throat and trouble swallowing.   Eyes: Negative for eye problems and icterus.  Respiratory: Negative for cough, hemoptysis, shortness of breath and wheezing.   Cardiovascular: Negative for chest pain  and leg swelling.  Gastrointestinal: Negative for abdominal pain, constipation, diarrhea, nausea and vomiting.  Genitourinary: Negative for bladder incontinence, difficulty urinating, dysuria, frequency and hematuria.   Musculoskeletal: Negative for back pain, gait problem, neck pain and neck stiffness.  Skin: Negative for itching and rash.  Neurological: Negative for dizziness, extremity weakness, gait problem, headaches, light-headedness and seizures.  Hematological: Negative for adenopathy. Does not bruise/bleed easily.  Psychiatric/Behavioral: Negative for confusion, depression and sleep disturbance. The patient is not nervous/anxious.     PHYSICAL EXAMINATION:  There were no vitals taken for this visit.  ECOG PERFORMANCE STATUS: {CHL ONC ECOG [id number]  Physical Exam  Constitutional: Oriented to person, place, and time and well-developed, well-nourished, and in no distress. No distress.  HENT:  Head: Normocephalic and atraumatic.  Mouth/Throat: Oropharynx is clear and moist. No oropharyngeal exudate.  Eyes: Conjunctivae are normal. Right eye exhibits no discharge. Left eye exhibits no discharge. No scleral icterus.  Neck: Normal range of motion. Neck supple.  Cardiovascular: Normal rate, regular rhythm, normal heart sounds and intact distal pulses.   Pulmonary/Chest: Effort normal and breath sounds normal. No respiratory distress. No wheezes. No rales.  Abdominal: Soft. Bowel sounds are normal. Exhibits no distension and no mass. There is no tenderness.  Musculoskeletal: Normal range of motion. Exhibits no edema.  Lymphadenopathy:    No cervical adenopathy.  Neurological: Alert and oriented to person, place, and time. Exhibits normal muscle tone. Gait normal. Coordination normal.  Skin: Skin is warm and dry. No rash noted. Not diaphoretic. No erythema. No pallor.  Psychiatric: Mood, memory and judgment normal.  Vitals reviewed.  LABORATORY DATA: Lab Results  Component  Value Date   WBC 1.4 (L) 05/26/2023   HGB 10.0 (L) 05/26/2023   HCT 28.5 (L) 05/26/2023   MCV 88.2 05/26/2023   PLT 117 (L) 05/26/2023      Chemistry      Component Value Date/Time   NA 139 05/26/2023 1521   K 3.6 05/26/2023 1521   CL 105 05/26/2023 1521   CO2 28 05/26/2023 1521   BUN 16 05/26/2023 1521   CREATININE 0.87 05/26/2023 1521      Component Value Date/Time   CALCIUM 9.1 05/26/2023 1521   ALKPHOS 61 05/26/2023 1521   AST 12 (L) 05/26/2023 1521   ALT 10 05/26/2023 1521   BILITOT 0.4 05/26/2023 1521       RADIOGRAPHIC STUDIES:  IR IMAGING GUIDED PORT INSERTION Addendum Date: 05/28/2023 ADDENDUM REPORT: 05/28/2023 07:47 ADDENDUM: FLUOROSCOPY: Radiation exposure index: Less than 1 mGy, reference air kerma. Electronically Signed   By: Fernando Hoyer M.D.   On: 05/28/2023 07:47   Result Date: 05/28/2023 INDICATION: 53 year old male with right upper lobe lung cancer. He presents for left-sided port catheter placement. EXAM: IMPLANTED PORT A CATH PLACEMENT WITH ULTRASOUND AND FLUOROSCOPIC GUIDANCE MEDICATIONS: None. ANESTHESIA/SEDATION: Versed  2 mg IV; Fentanyl  100 mcg IV; administered by the radiology nurse Moderate Sedation Time:  24 minutes The patient's vital signs and level of consciousness were continuously monitored during the procedure by the interventional radiology nurse under my direct supervision. FLUOROSCOPY: Radiation exposure index: Less than 1 mGy COMPLICATIONS: None immediate.  PROCEDURE: The left neck and chest was prepped with chlorhexidine , and draped in the usual sterile fashion using maximum barrier technique (cap and mask, sterile gown, sterile gloves, large sterile sheet, hand hygiene and cutaneous antiseptic). Local anesthesia was attained by infiltration with 1% lidocaine  with epinephrine . Ultrasound demonstrated patency of the left internal jugular vein vein, and this was documented with an image. Under real-time ultrasound guidance, this vein was  accessed with a 21 gauge micropuncture needle and image documentation was performed. A small dermatotomy was made at the access site with an 11 scalpel. A 0.018" wire was advanced into the SVC and the access needle exchanged for a 66F micropuncture vascular sheath. The 0.018" wire was then removed and a 0.035" wire advanced into the IVC. An appropriate location for the subcutaneous reservoir was selected below the clavicle and an incision was made through the skin and underlying soft tissues. The subcutaneous tissues were then dissected using a combination of blunt and sharp surgical technique and a pocket was formed. A Bard Clear Vue single lumen power injectable portacatheter was then tunneled through the subcutaneous tissues from the pocket to the dermatotomy and the port reservoir placed within the subcutaneous pocket. The venous access site was then serially dilated and a peel away vascular sheath placed over the wire. The wire was removed and the port catheter advanced into position under fluoroscopic guidance. The catheter tip is positioned in the superior cavoatrial junction. This was documented with a spot image. The portacatheter was then tested and found to flush and aspirate well. The port was flushed with saline followed by 100 units/mL heparinized saline. The pocket was then closed in two layers using first subdermal inverted interrupted absorbable sutures followed by a running subcuticular suture. The epidermis was then sealed with Dermabond. The dermatotomy at the venous access site was also closed with Dermabond. IMPRESSION: Successful placement of a left IJ approach Bard Clear Vue power injectable port catheter with ultrasound and fluoroscopic guidance. The catheter is ready for use. Electronically Signed: By: Fernando Hoyer M.D. On: 05/16/2023 15:12     ASSESSMENT/PLAN:  This is a very pleasant 53 year old Caucasian male diagnosed with at least stage IIIC Small cell lung cancer (T4, N3,  M0).  He presented with a large right upper lobe lung mass with mediastinal invasion, right pleural/subpleural involvement, left supraclavicular adenopathy, and possible pelvic peritoneal nodule.  He was diagnosed in March 2025.   The patient is currently undergoing radiation to the large right upper lung mass/CBC syndrome in the last day radiation is tentatively scheduled for 06/06/2023.   He started palliative  systemic chemotherapy with carboplatin  for an AUC of 5 day 1 and etoposide  100 mg/m on day 1, 2, and 3 on 05/12/23. He is status post 1 cycle. We will add neulasta and immunotherapy once he completes radiation.    Labs were reviewed. Recommend he *** with cycle #2 today as scheduled.    Neutropenia due to chemotherapy Mild neutropenia with neutrophil count of 1.4, secondary to chemotherapy. No infection signs. Injections on hold until post-radiation. - Monitor neutrophil count weekly. - Educated on infection prevention: wash fruits and vegetables, avoid sick contacts, mask, and practice hand hygiene. - Report any signs of infection immediately.   Nausea due to chemotherapy Mild nausea managed with antiemetics. No vomiting. Symptoms resolved with medication. - Continue antiemetics as needed.   Loss of appetite Decreased appetite post-chemotherapy, improved with Decadron . Prednisone  avoided. Discussed alternative stimulants, which he is not interested. Consuming Ensure for  nutrition. - Continue two Ensure supplements daily. - Increase snacking to boost caloric intake. - Follow up with nutritionist on May 12.    Smoking Long-term smoker attempting cessation. Increased smoking due to boredom. Discussed reduction strategies. - Encourage activities to reduce boredom. - Consider substitutes or sucking hard candy canes for cravings.   The patient was advised to call immediately if she has any concerning symptoms in the interval. The patient voices understanding of current disease  status and treatment options and is in agreement with the current care plan. All questions were answered. The patient knows to call the clinic with any problems, questions or concerns. We can certainly see the patient much sooner if necessary    No orders of the defined types were placed in this encounter.    I spent {CHL ONC TIME VISIT - ZOXWR:[id number]} counseling the patient face to face. The total time spent in the appointment was {CHL ONC TIME VISIT - BJYNW:[id number]}.  Angus Amini L Piotr Christopher, PA-C 05/31/23

## 2023-06-02 ENCOUNTER — Other Ambulatory Visit: Payer: Self-pay | Admitting: Physician Assistant

## 2023-06-02 ENCOUNTER — Other Ambulatory Visit: Payer: Self-pay

## 2023-06-02 ENCOUNTER — Ambulatory Visit: Payer: PRIVATE HEALTH INSURANCE

## 2023-06-02 ENCOUNTER — Other Ambulatory Visit

## 2023-06-02 ENCOUNTER — Inpatient Hospital Stay: Payer: PRIVATE HEALTH INSURANCE

## 2023-06-02 ENCOUNTER — Inpatient Hospital Stay (HOSPITAL_BASED_OUTPATIENT_CLINIC_OR_DEPARTMENT_OTHER): Payer: PRIVATE HEALTH INSURANCE | Admitting: Physician Assistant

## 2023-06-02 ENCOUNTER — Ambulatory Visit
Admission: RE | Admit: 2023-06-02 | Discharge: 2023-06-02 | Disposition: A | Discharge status: Home / Self Care | Payer: PRIVATE HEALTH INSURANCE | Source: Ambulatory Visit | Attending: Radiation Oncology | Admitting: Radiation Oncology

## 2023-06-02 ENCOUNTER — Encounter: Payer: Self-pay | Admitting: Internal Medicine

## 2023-06-02 VITALS — BP 100/71 | HR 99 | Temp 97.6°F | Resp 16 | Wt 179.4 lb

## 2023-06-02 DIAGNOSIS — C3411 Malignant neoplasm of upper lobe, right bronchus or lung: Secondary | ICD-10-CM | POA: Diagnosis not present

## 2023-06-02 DIAGNOSIS — Z95828 Presence of other vascular implants and grafts: Secondary | ICD-10-CM

## 2023-06-02 DIAGNOSIS — Z5111 Encounter for antineoplastic chemotherapy: Secondary | ICD-10-CM | POA: Diagnosis not present

## 2023-06-02 LAB — CMP (CANCER CENTER ONLY)
ALT: 8 U/L (ref 0–44)
AST: 13 U/L — ABNORMAL LOW (ref 15–41)
Albumin: 4 g/dL (ref 3.5–5.0)
Alkaline Phosphatase: 61 U/L (ref 38–126)
Anion gap: 5 (ref 5–15)
BUN: 14 mg/dL (ref 6–20)
CO2: 29 mmol/L (ref 22–32)
Calcium: 9.2 mg/dL (ref 8.9–10.3)
Chloride: 106 mmol/L (ref 98–111)
Creatinine: 0.87 mg/dL (ref 0.61–1.24)
GFR, Estimated: >60 mL/min (ref >60)
Glucose, Bld: 138 mg/dL — ABNORMAL HIGH (ref 70–99)
Potassium: 4 mmol/L (ref 3.5–5.1)
Sodium: 140 mmol/L (ref 135–145)
Total Bilirubin: 0.5 mg/dL (ref 0.0–1.2)
Total Protein: 6.7 g/dL (ref 6.5–8.1)

## 2023-06-02 LAB — CBC WITH DIFFERENTIAL (CANCER CENTER ONLY)
Abs Immature Granulocytes: 0.09 10*3/uL — ABNORMAL HIGH (ref 0.00–0.07)
Basophils Absolute: 0 10*3/uL (ref 0.0–0.1)
Basophils Relative: 2 %
Eosinophils Absolute: 0 10*3/uL (ref 0.0–0.5)
Eosinophils Relative: 2 %
HCT: 34.1 % — ABNORMAL LOW (ref 39.0–52.0)
Hemoglobin: 11.4 g/dL — ABNORMAL LOW (ref 13.0–17.0)
Immature Granulocytes: 5 %
Lymphocytes Relative: 13 %
Lymphs Abs: 0.3 10*3/uL — ABNORMAL LOW (ref 0.7–4.0)
MCH: 30.8 pg (ref 26.0–34.0)
MCHC: 33.4 g/dL (ref 30.0–36.0)
MCV: 92.2 fL (ref 80.0–100.0)
Monocytes Absolute: 0.6 10*3/uL (ref 0.1–1.0)
Monocytes Relative: 32 %
Neutro Abs: 1 10*3/uL — ABNORMAL LOW (ref 1.7–7.7)
Neutrophils Relative %: 46 %
Platelet Count: 291 10*3/uL (ref 150–400)
RBC: 3.7 MIL/uL — ABNORMAL LOW (ref 4.22–5.81)
RDW: 19 % — ABNORMAL HIGH (ref 11.5–15.5)
WBC Count: 2 10*3/uL — ABNORMAL LOW (ref 4.0–10.5)
nRBC: 0 % (ref 0.0–0.2)

## 2023-06-02 LAB — RAD ONC ARIA SESSION SUMMARY
Course Elapsed Days: 47
Plan Fractions Treated to Date: 5
Plan Prescribed Dose Per Fraction: 2 Gy
Plan Total Fractions Prescribed: 17
Plan Total Prescribed Dose: 34 Gy
Reference Point Dosage Given to Date: 46 Gy
Reference Point Session Dosage Given: 2 Gy
Session Number: 23

## 2023-06-02 MED ORDER — SODIUM CHLORIDE 0.9% FLUSH
10.0000 mL | Freq: Once | INTRAVENOUS | Status: AC
  Administered 2023-06-02: 10 mL

## 2023-06-02 NOTE — Progress Notes (Signed)
 Nutrition  RD planning to see patient during infusion today but cancelled due to neutropenia.    Nutrition appointment rescheduled to Wed 6/4 during infusion.   Fredrick Geoghegan B. Zollie Hipp, CSO, LDN Registered Dietitian [phone]

## 2023-06-02 NOTE — Progress Notes (Signed)
  Radiation Oncology         [phone] ________________________________  Name: Eric Foley MRN: [id number]  Date: 05/30/2023  DOB: 1970/10/30  STEREOTACTIC BODY RADIOTHERAPY SIMULATION AND TREATMENT PLANNING NOTE    ICD-10-CM   1. 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  Identity was confirmed.  All relevant records and images related to the planned course of therapy were reviewed.  The patient freely provided informed written consent to proceed with treatment after reviewing the details related to the planned course of therapy. The consent form was witnessed and verified by the simulation staff.  Then, the patient was set-up in a stable reproducible  supine position for radiation therapy.  A BodyFix immobilization pillow was fabricated for reproducible positioning.  Surface markings were placed.  The CT images were loaded into the planning software.  The gross target volumes (GTV) and planning target volumes (PTV) were delinieated, and avoidance structures were contoured.  Treatment planning then occurred.  The radiation prescription was entered and confirmed.  A total of two complex treatment devices were fabricated in the form of the BodyFix immobilization pillow and a neck accuform cushion.  I have requested : 3D Simulation  I have requested a DVH of the following structures: targets and all normal structures near the target including bladder, rectum, skin and others as noted on the radiation plan to maintain doses in adherence with established limits  SPECIAL TREATMENT PROCEDURE:  The planned course of therapy using radiation constitutes a special treatment procedure. Special care is required in the management of this patient for the following reasons. High dose per fraction requiring  special monitoring for increased toxicities of treatment including daily imaging..  The special nature of the planned course of radiotherapy will require increased physician supervision and oversight to ensure patient's safety with optimal treatment outcomes.    This requires extended time and effort.    PLAN:  The patient will receive 50 Gy in 5 fractions.  ________________________________  Trilby Fujisawa Lorri Rota, M.D.

## 2023-06-03 ENCOUNTER — Ambulatory Visit: Payer: PRIVATE HEALTH INSURANCE

## 2023-06-03 ENCOUNTER — Inpatient Hospital Stay: Payer: PRIVATE HEALTH INSURANCE

## 2023-06-03 ENCOUNTER — Telehealth: Payer: Self-pay | Admitting: Physician Assistant

## 2023-06-03 NOTE — Telephone Encounter (Signed)
 Left the patient a voicemail with the updated appointment details. The patient is active on MyChart.

## 2023-06-04 ENCOUNTER — Ambulatory Visit: Payer: PRIVATE HEALTH INSURANCE

## 2023-06-04 ENCOUNTER — Inpatient Hospital Stay: Payer: PRIVATE HEALTH INSURANCE

## 2023-06-04 ENCOUNTER — Other Ambulatory Visit: Payer: Self-pay

## 2023-06-05 ENCOUNTER — Other Ambulatory Visit: Payer: Self-pay

## 2023-06-05 ENCOUNTER — Ambulatory Visit: Payer: PRIVATE HEALTH INSURANCE

## 2023-06-05 ENCOUNTER — Ambulatory Visit
Admission: RE | Admit: 2023-06-05 | Discharge: 2023-06-05 | Disposition: A | Discharge status: Home / Self Care | Payer: PRIVATE HEALTH INSURANCE | Source: Ambulatory Visit | Attending: Radiation Oncology | Admitting: Radiation Oncology

## 2023-06-05 DIAGNOSIS — Z5111 Encounter for antineoplastic chemotherapy: Secondary | ICD-10-CM | POA: Diagnosis not present

## 2023-06-05 LAB — RAD ONC ARIA SESSION SUMMARY
Course Elapsed Days: 50
Plan Fractions Treated to Date: 6
Plan Prescribed Dose Per Fraction: 2 Gy
Plan Total Fractions Prescribed: 17
Plan Total Prescribed Dose: 34 Gy
Reference Point Dosage Given to Date: 48 Gy
Reference Point Session Dosage Given: 2 Gy
Session Number: 24

## 2023-06-06 ENCOUNTER — Ambulatory Visit: Payer: PRIVATE HEALTH INSURANCE

## 2023-06-06 ENCOUNTER — Ambulatory Visit
Admission: RE | Admit: 2023-06-06 | Discharge: 2023-06-06 | Disposition: A | Discharge status: Home / Self Care | Payer: PRIVATE HEALTH INSURANCE | Source: Ambulatory Visit | Attending: Radiation Oncology | Admitting: Radiation Oncology

## 2023-06-06 ENCOUNTER — Other Ambulatory Visit: Payer: Self-pay

## 2023-06-06 DIAGNOSIS — Z5111 Encounter for antineoplastic chemotherapy: Secondary | ICD-10-CM | POA: Diagnosis not present

## 2023-06-06 LAB — RAD ONC ARIA SESSION SUMMARY
Course Elapsed Days: 51
Plan Fractions Treated to Date: 7
Plan Prescribed Dose Per Fraction: 2 Gy
Plan Total Fractions Prescribed: 17
Plan Total Prescribed Dose: 34 Gy
Reference Point Dosage Given to Date: 50 Gy
Reference Point Session Dosage Given: 2 Gy
Session Number: 25

## 2023-06-07 ENCOUNTER — Ambulatory Visit: Payer: PRIVATE HEALTH INSURANCE

## 2023-06-08 NOTE — Progress Notes (Signed)
 [facility]   [phone] [phone]) [phone]    Patient Care Team: Patient, No Pcp Per as PCP - General (General Practice) Rosalita Combe, RN as Oncology Nurse Navigator   CHIEF COMPLAINT: Follow up small cell lung cancer   Oncology History  Small cell lung cancer (HCC)  05/01/2023 Initial Diagnosis   Small cell lung cancer (HCC)   05/01/2023 Cancer Staging   Staging form: Lung, AJCC V9 - Clinical: Stage IVA (cT4, cN3, cM1b) - Signed by Marlene Simas, MD on 05/01/2023   05/12/2023 -  Chemotherapy   Patient is on Treatment Plan : LUNG SMALL CELL EXTENSIVE STAGE Durvalumab + Carboplatin  D1 + Etoposide  D1-3 q21d x 4 Cycles / Durvalumab q28d        CURRENT THERAPY:  1) radiation to the large right lung mass under the care of Dr. Lorri Rota and oligo pelvic metastasis 2) palliative systemic chemotherapy with carboplatin  for an AUC of 5 on day 1 and etoposide  100 mg/m on days 1, 2, and 3 IV every 3 weeks.  We will add Neulasta and Imfinzi once he completes his radiation to the chest. First dose on 05/12/23  INTERVAL HISTORY Mr. Bitting returns for follow up as scheduled, for consideration for cycle 2 pending count recovery. He felt well on the extra week off. Breathing continues to improve. Mild nausea is well controlled. Eating/drinking well. He is completing treatment for a shingles rash that has dried/scabbed over.   ROS  All other systems reviewed and negative  Past Medical History:  Diagnosis Date   COPD (chronic obstructive pulmonary disease) (HCC)    History of kidney stones      Past Surgical History:  Procedure Laterality Date   BRONCHIAL BIOPSY  04/22/2023   Procedure: BRONCHOSCOPY, WITH BIOPSY;  Surgeon: Denson Flake, MD;  Location: [facility] ENDOSCOPY;  Service: Pulmonary;;   BRONCHIAL BRUSHINGS  04/22/2023   Procedure: BRONCHOSCOPY, WITH BRUSH BIOPSY;  Surgeon: Denson Flake, MD;  Location: [facility] ENDOSCOPY;  Service: Pulmonary;;   FLEXIBLE  BRONCHOSCOPY  04/22/2023   Procedure: Cordella Deter;  Surgeon: Denson Flake, MD;  Location: [facility] ENDOSCOPY;  Service: Pulmonary;;   HEMOSTASIS CLIP PLACEMENT  04/22/2023   Procedure: CONTROL OF HEMORRHAGE, lung;  Surgeon: Denson Flake, MD;  Location: [facility] ENDOSCOPY;  Service: Pulmonary;;   IR IMAGING GUIDED PORT INSERTION  05/16/2023   TONSILLECTOMY       Outpatient Encounter Medications as of 06/09/2023  Medication Sig   Aspirin-Acetaminophen (GOODYS BODY PAIN PO) Take by mouth 2 (two) times daily.   lidocaine -prilocaine  (EMLA ) cream Apply 1 Application topically as needed.   Potassium 99 MG TABS Take 99 mg by mouth 2 (two) times daily.   prochlorperazine  (COMPAZINE ) 10 MG tablet Take 1 tablet (10 mg total) by mouth every 6 (six) hours as needed.   Facility-Administered Encounter Medications as of 06/09/2023  Medication   0.9 %  sodium chloride  infusion   CARBOplatin  (PARAPLATIN ) 670 mg in sodium chloride  0.9 % 250 mL chemo infusion   [COMPLETED] dexamethasone  (DECADRON ) injection 10 mg   etoposide  (VEPESID ) 200 mg in sodium chloride  0.9 % 500 mL chemo infusion   [COMPLETED] fosaprepitant  (EMEND) 150 mg in sodium chloride  0.9 % 145 mL IVPB   heparin  lock flush 100 unit/mL   [COMPLETED] palonosetron  (ALOXI ) injection 0.25 mg   sodium chloride  flush (NS) 0.9 % injection 10 mL   trilaciclib dihydrochloride  (COSELA ) 480 mg in dextrose  5 % 500 mL (0.9023  mg/mL) infusion     There were no vitals filed for this visit. There is no height or weight on file to calculate BMI.   ECOG PERFORMANCE STATUS: 1 - Symptomatic but completely ambulatory  PHYSICAL EXAM GENERAL:alert, no distress and comfortable SKIN: dried/scabbed clustered rash over left chest  EYES: sclera clear NECK: without mass or swelling LYMPH:  no palpable cervical or supraclavicular lymphadenopathy  LUNGS: clear with normal breathing effort HEART: regular rate & rhythm, no lower extremity edema ABDOMEN: abdomen soft,  non-tender and normal bowel sounds NEURO: alert & oriented x 3 with fluent speech, no focal motor/sensory deficits  PAC without erythema    CBC    Latest Ref Rng & Units 06/09/2023    9:57 AM 06/02/2023    9:36 AM 05/26/2023    3:21 PM  CBC  WBC 4.0 - 10.5 K/uL 4.7  2.0  1.4   Hemoglobin 13.0 - 17.0 g/dL 16.1  09.6  04.5   Hematocrit 39.0 - 52.0 % 36.7  34.1  28.5   Platelets 150 - 400 K/uL 333  291  117       CMP     Latest Ref Rng & Units 06/09/2023    9:57 AM 06/02/2023    9:36 AM 05/26/2023    3:21 PM  CMP  Glucose 70 - 99 mg/dL 409  811  86   BUN 6 - 20 mg/dL 17  14  16    Creatinine 0.61 - 1.24 mg/dL 9.14  7.82  9.56   Sodium 135 - 145 mmol/L 140  140  139   Potassium 3.5 - 5.1 mmol/L 4.0  4.0  3.6   Chloride 98 - 111 mmol/L 107  106  105   CO2 22 - 32 mmol/L 27  29  28    Calcium 8.9 - 10.3 mg/dL 9.3  9.2  9.1   Total Protein 6.5 - 8.1 g/dL 6.8  6.7  6.5   Total Bilirubin 0.0 - 1.2 mg/dL 0.4  0.5  0.4   Alkaline Phos 38 - 126 U/L 56  61  61   AST 15 - 41 U/L 13  13  12    ALT 0 - 44 U/L 9  8  10        ASSESSMENT & PLAN: 53 yo male   Small cell lung cancer (T4,N3) Extensive stage -Diagnosed 03/2023; presented with a large right upper lobe lung mass with mediastinal invasion, right pleural/subpleural involvement, left supraclavicular adenopathy, and possible pelvic peritoneal nodule. -Undergoing radiation to the large right upper lung mass/SVC syndrome and SBRT to pelvic nodule -Began palliative systemic chemotherapy with carboplatin  for an AUC of 5 day 1 and etoposide  100 mg/m on day 1, 2, and 3 on 05/12/23. S/p cycle 1. Plan to add immunotherapy after radiation -Mr. Byrd has recovered from cycle 1, cycle 2 postponed for neutropenia which has resolved. We will push ahead at full dose per Dr. Marguerita Shih. The plan is to add GCSF after radiation.  -Respiratory status has improved on chemoRT indicating a clinical response -Weekly lab/flush and f/up with cycle 3 in 3  weeks   PLAN: -Labs reviewed -Proceed with Cycle 2 day 1 carbo/etoposide  today, no dose modifications -Continue radiation -Weekly lab -F/up with cycle 3 -Case reviewed with Dr. Marguerita Shih     All questions were answered. The patient knows to call the clinic with any problems, questions or concerns. No barriers to learning were detected.   Terissa Haffey K Lakesha Levinson, NP 06/09/2023

## 2023-06-09 ENCOUNTER — Ambulatory Visit
Admission: RE | Admit: 2023-06-09 | Discharge: 2023-06-09 | Disposition: A | Discharge status: Home / Self Care | Payer: PRIVATE HEALTH INSURANCE | Source: Ambulatory Visit | Attending: Radiation Oncology | Admitting: Radiation Oncology

## 2023-06-09 ENCOUNTER — Inpatient Hospital Stay: Payer: PRIVATE HEALTH INSURANCE | Admitting: Nurse Practitioner

## 2023-06-09 ENCOUNTER — Inpatient Hospital Stay: Payer: PRIVATE HEALTH INSURANCE

## 2023-06-09 ENCOUNTER — Ambulatory Visit: Payer: PRIVATE HEALTH INSURANCE

## 2023-06-09 ENCOUNTER — Encounter: Payer: Self-pay | Admitting: Internal Medicine

## 2023-06-09 ENCOUNTER — Encounter: Payer: Self-pay | Admitting: Nurse Practitioner

## 2023-06-09 ENCOUNTER — Other Ambulatory Visit: Payer: Self-pay

## 2023-06-09 VITALS — BP 119/72 | HR 91 | Temp 98.0°F | Resp 16

## 2023-06-09 DIAGNOSIS — C3490 Malignant neoplasm of unspecified part of unspecified bronchus or lung: Secondary | ICD-10-CM

## 2023-06-09 DIAGNOSIS — C3411 Malignant neoplasm of upper lobe, right bronchus or lung: Secondary | ICD-10-CM

## 2023-06-09 DIAGNOSIS — Z5111 Encounter for antineoplastic chemotherapy: Secondary | ICD-10-CM | POA: Diagnosis not present

## 2023-06-09 DIAGNOSIS — Z95828 Presence of other vascular implants and grafts: Secondary | ICD-10-CM

## 2023-06-09 LAB — RAD ONC ARIA SESSION SUMMARY
Course Elapsed Days: 54
Plan Fractions Treated to Date: 8
Plan Prescribed Dose Per Fraction: 2 Gy
Plan Total Fractions Prescribed: 17
Plan Total Prescribed Dose: 34 Gy
Reference Point Dosage Given to Date: 52 Gy
Reference Point Session Dosage Given: 2 Gy
Session Number: 26

## 2023-06-09 LAB — CMP (CANCER CENTER ONLY)
ALT: 9 U/L (ref 0–44)
AST: 13 U/L — ABNORMAL LOW (ref 15–41)
Albumin: 4.1 g/dL (ref 3.5–5.0)
Alkaline Phosphatase: 56 U/L (ref 38–126)
Anion gap: 6 (ref 5–15)
BUN: 17 mg/dL (ref 6–20)
CO2: 27 mmol/L (ref 22–32)
Calcium: 9.3 mg/dL (ref 8.9–10.3)
Chloride: 107 mmol/L (ref 98–111)
Creatinine: 0.88 mg/dL (ref 0.61–1.24)
GFR, Estimated: >60 mL/min (ref >60)
Glucose, Bld: 122 mg/dL — ABNORMAL HIGH (ref 70–99)
Potassium: 4 mmol/L (ref 3.5–5.1)
Sodium: 140 mmol/L (ref 135–145)
Total Bilirubin: 0.4 mg/dL (ref 0.0–1.2)
Total Protein: 6.8 g/dL (ref 6.5–8.1)

## 2023-06-09 LAB — CBC WITH DIFFERENTIAL (CANCER CENTER ONLY)
Abs Immature Granulocytes: 0.11 K/uL — ABNORMAL HIGH (ref 0.00–0.07)
Basophils Absolute: 0.1 K/uL (ref 0.0–0.1)
Basophils Relative: 2 %
Eosinophils Absolute: 0 K/uL (ref 0.0–0.5)
Eosinophils Relative: 1 %
HCT: 36.7 % — ABNORMAL LOW (ref 39.0–52.0)
Hemoglobin: 12.5 g/dL — ABNORMAL LOW (ref 13.0–17.0)
Immature Granulocytes: 2 %
Lymphocytes Relative: 8 %
Lymphs Abs: 0.4 K/uL — ABNORMAL LOW (ref 0.7–4.0)
MCH: 31.6 pg (ref 26.0–34.0)
MCHC: 34.1 g/dL (ref 30.0–36.0)
MCV: 92.9 fL (ref 80.0–100.0)
Monocytes Absolute: 0.9 K/uL (ref 0.1–1.0)
Monocytes Relative: 19 %
Neutro Abs: 3.2 K/uL (ref 1.7–7.7)
Neutrophils Relative %: 68 %
Platelet Count: 333 K/uL (ref 150–400)
RBC: 3.95 MIL/uL — ABNORMAL LOW (ref 4.22–5.81)
RDW: 18.6 % — ABNORMAL HIGH (ref 11.5–15.5)
WBC Count: 4.7 K/uL (ref 4.0–10.5)
nRBC: 0 % (ref 0.0–0.2)

## 2023-06-09 MED ORDER — SODIUM CHLORIDE 0.9% FLUSH
10.0000 mL | Freq: Once | INTRAVENOUS | Status: AC
  Administered 2023-06-09: 10 mL

## 2023-06-09 MED ORDER — HEPARIN SOD (PORK) LOCK FLUSH 100 UNIT/ML IV SOLN
500.0000 [IU] | Freq: Once | INTRAVENOUS | Status: AC | PRN
  Administered 2023-06-09: 500 [IU]

## 2023-06-09 MED ORDER — SODIUM CHLORIDE 0.9 % IV SOLN
100.0000 mg/m2 | Freq: Once | INTRAVENOUS | Status: AC
  Administered 2023-06-09: 200 mg via INTRAVENOUS
  Filled 2023-06-09: qty 10

## 2023-06-09 MED ORDER — SODIUM CHLORIDE 0.9 % IV SOLN: INTRAVENOUS | Status: DC

## 2023-06-09 MED ORDER — SODIUM CHLORIDE 0.9 % IV SOLN
665.0000 mg | Freq: Once | INTRAVENOUS | Status: AC
  Administered 2023-06-09: 670 mg via INTRAVENOUS
  Filled 2023-06-09: qty 67

## 2023-06-09 MED ORDER — DEXAMETHASONE SODIUM PHOSPHATE 10 MG/ML IJ SOLN
10.0000 mg | Freq: Once | INTRAMUSCULAR | Status: AC
  Administered 2023-06-09: 10 mg via INTRAVENOUS
  Filled 2023-06-09: qty 1

## 2023-06-09 MED ORDER — SODIUM CHLORIDE 0.9 % IV SOLN
150.0000 mg | Freq: Once | INTRAVENOUS | Status: AC
  Administered 2023-06-09: 150 mg via INTRAVENOUS
  Filled 2023-06-09: qty 150

## 2023-06-09 MED ORDER — SODIUM CHLORIDE 0.9% FLUSH
10.0000 mL | INTRAVENOUS | Status: DC | PRN
  Administered 2023-06-09: 10 mL

## 2023-06-09 MED ORDER — PALONOSETRON HCL INJECTION 0.25 MG/5ML
0.2500 mg | Freq: Once | INTRAVENOUS | Status: AC
  Administered 2023-06-09: 0.25 mg via INTRAVENOUS
  Filled 2023-06-09: qty 5

## 2023-06-09 MED ORDER — TRILACICLIB DIHYDROCHLORIDE INJECTION 300 MG
240.0000 mg/m2 | Freq: Once | INTRAVENOUS | Status: AC
Start: 2023-06-09 — End: 2023-06-09
  Administered 2023-06-09: 480 mg via INTRAVENOUS
  Filled 2023-06-09: qty 32

## 2023-06-09 NOTE — Patient Instructions (Signed)
 [facility] - A DEPT OF [place]  Discharge Instructions: Thank you for choosing [facility] to provide your oncology and hematology care.   If you have a lab appointment with the Cancer Center, please go directly to the Cancer Center and check in at the registration area.   Wear comfortable clothing and clothing appropriate for easy access to any Portacath or PICC line.   We strive to give you quality time with your provider. You may need to reschedule your appointment if you arrive late (15 or more minutes).  Arriving late affects you and other patients whose appointments are after yours.  Also, if you miss three or more appointments without notifying the office, you may be dismissed from the clinic at the provider's discretion.      For prescription refill requests, have your pharmacy contact our office and allow 72 hours for refills to be completed.    Today you received the following chemotherapy and/or immunotherapy agents CARBOPLATIN  and ETOPOSIDE  + Cosela    To help prevent nausea and vomiting after your treatment, we encourage you to take your nausea medication as directed.  BELOW ARE SYMPTOMS THAT SHOULD BE REPORTED IMMEDIATELY: *FEVER GREATER THAN 100.4 F (38 C) OR HIGHER *CHILLS OR SWEATING *NAUSEA AND VOMITING THAT IS NOT CONTROLLED WITH YOUR NAUSEA MEDICATION *UNUSUAL SHORTNESS OF BREATH *UNUSUAL BRUISING OR BLEEDING *URINARY PROBLEMS (pain or burning when urinating, or frequent urination) *BOWEL PROBLEMS (unusual diarrhea, constipation, pain near the anus) TENDERNESS IN MOUTH AND THROAT WITH OR WITHOUT PRESENCE OF ULCERS (sore throat, sores in mouth, or a toothache) UNUSUAL RASH, SWELLING OR PAIN  UNUSUAL VAGINAL DISCHARGE OR ITCHING   Items with * indicate a potential emergency and should be followed up as soon as possible or go to the Emergency Department if any problems should occur.  Please show the CHEMOTHERAPY ALERT  CARD or IMMUNOTHERAPY ALERT CARD at check-in to the Emergency Department and triage nurse.  Should you have questions after your visit or need to cancel or reschedule your appointment, please contact [facility] - A DEPT OF [facility]  Dept: [phone]  and follow the prompts.  Office hours are 8:00 a.m. to 4:30 p.m. Monday - Friday. Please note that voicemails left after 4:00 p.m. may not be returned until the following business day.  We are closed weekends and major holidays. You have access to a nurse at all times for urgent questions. Please call the main number to the clinic Dept: [phone] and follow the prompts.   For any non-urgent questions, you may also contact your provider using MyChart. We now offer e-Visits for anyone 52 and older to request care online for non-urgent symptoms. For details visit [email or web address].   Also download the MyChart app! Go to the app store, search "MyChart", open the app, select [facility], and log in with your MyChart username and password.  Trilaciclib Injection What is this medication? TRILACICLIB (TRYE la SYE klib) prevents low levels of red blood cells, white blood cells, and platelets caused by chemotherapy. It works by protecting the cells in your bone marrow that make these blood cells. This lowers the risk of infection and bleeding. It also reduces the need for blood transfusions. This medicine may be used for other purposes; ask your health care provider or pharmacist if you have questions. COMMON BRAND NAME(S): COSELA  What should I tell my care team before I take this  medication? They need to know if you have any of these conditions: Liver disease An unusual or allergic reaction to trilaciclib, other medications, foods, dyes, or preservatives Pregnant or trying to get pregnant Breastfeeding How should I use this medication? This medication is injected into a vein. It is given by your care team in a  hospital or clinic setting. Talk to your care team about the use of this medication in children. Special care may be needed. Overdosage: If you think you have taken too much of this medicine contact a poison control center or emergency room at once. NOTE: This medicine is only for you. Do not share this medicine with others. What if I miss a dose? Keep appointments for follow-up doses. It is important to not miss your dose. Call your care team if you are unable to keep an appointment. What may interact with this medication? Cisplatin Dalfampridine Dofetilide Metformin This list may not describe all possible interactions. Give your health care provider a list of all the medicines, herbs, non-prescription drugs, or dietary supplements you use. Also tell them if you smoke, drink alcohol, or use illegal drugs. Some items may interact with your medicine. What should I watch for while using this medication? Your condition will be monitored carefully while you are receiving this medication. Talk to your care team if you may be pregnant. Serious birth defects can occur if you take this medication during pregnancy and for 3 weeks after the last dose. You will need a negative pregnancy test before starting this medication. Contraception is recommended while taking this medication and for 3 weeks after the last dose. Your care team can help you find the option that works for you. Do not breastfeed while taking this medication and for 3 weeks after the last dose. This medication may cause infertility. Talk to your care team if you are concerned about your fertility. What side effects may I notice from receiving this medication? Side effects that you should report to your care team as soon as possible: Allergic reactions--skin rash, itching, hives, swelling of the face, lips, tongue, or throat Dry cough, shortness of breath or trouble breathing Painful swelling, warmth, or redness of the skin, blisters or  sores at the infusion site Side effects that usually do not require medical attention (report these to your care team if they continue or are bothersome): Fatigue Headache This list may not describe all possible side effects. Call your doctor for medical advice about side effects. You may report side effects to FDA at 1-800-FDA-1088. Where should I keep my medication? This medication is given in a hospital or clinic. It will not be stored at home. NOTE: This sheet is a summary. It may not cover all possible information. If you have questions about this medicine, talk to your doctor, pharmacist, or health care provider. Carboplatin  Injection What is this medication? CARBOPLATIN  (KAR boe pla tin) treats some types of cancer. It works by slowing down the growth of cancer cells. This medicine may be used for other purposes; ask your health care provider or pharmacist if you have questions. COMMON BRAND NAME(S): Paraplatin  What should I tell my care team before I take this medication? They need to know if you have any of these conditions: Blood disorders Hearing problems Kidney disease Recent or ongoing radiation therapy An unusual or allergic reaction to carboplatin , cisplatin, other medications, foods, dyes, or preservatives Pregnant or trying to get pregnant Breast-feeding How should I use this medication? This medication is injected  into a vein. It is given by your care team in a hospital or clinic setting. Talk to your care team about the use of this medication in children. Special care may be needed. Overdosage: If you think you have taken too much of this medicine contact a poison control center or emergency room at once. NOTE: This medicine is only for you. Do not share this medicine with others. What if I miss a dose? Keep appointments for follow-up doses. It is important not to miss your dose. Call your care team if you are unable to keep an appointment. What may interact with this  medication? Medications for seizures Some antibiotics, such as amikacin, gentamicin, neomycin, streptomycin, tobramycin Vaccines This list may not describe all possible interactions. Give your health care provider a list of all the medicines, herbs, non-prescription drugs, or dietary supplements you use. Also tell them if you smoke, drink alcohol, or use illegal drugs. Some items may interact with your medicine. What should I watch for while using this medication? Your condition will be monitored carefully while you are receiving this medication. You may need blood work while taking this medication. This medication may make you feel generally unwell. This is not uncommon, as chemotherapy can affect healthy cells as well as cancer cells. Report any side effects. Continue your course of treatment even though you feel ill unless your care team tells you to stop. In some cases, you may be given additional medications to help with side effects. Follow all directions for their use. This medication may increase your risk of getting an infection. Call your care team for advice if you get a fever, chills, sore throat, or other symptoms of a cold or flu. Do not treat yourself. Try to avoid being around people who are sick. Avoid taking medications that contain aspirin, acetaminophen, ibuprofen, naproxen, or ketoprofen unless instructed by your care team. These medications may hide a fever. Be careful brushing or flossing your teeth or using a toothpick because you may get an infection or bleed more easily. If you have any dental work done, tell your dentist you are receiving this medication. Talk to your care team if you wish to become pregnant or think you might be pregnant. This medication can cause serious birth defects. Talk to your care team about effective forms of contraception. Do not breast-feed while taking this medication. What side effects may I notice from receiving this medication? Side effects  that you should report to your care team as soon as possible: Allergic reactions--skin rash, itching, hives, swelling of the face, lips, tongue, or throat Infection--fever, chills, cough, sore throat, wounds that don't heal, pain or trouble when passing urine, general feeling of discomfort or being unwell Low red blood cell level--unusual weakness or fatigue, dizziness, headache, trouble breathing Pain, tingling, or numbness in the hands or feet, muscle weakness, change in vision, confusion or trouble speaking, loss of balance or coordination, trouble walking, seizures Unusual bruising or bleeding Side effects that usually do not require medical attention (report to your care team if they continue or are bothersome): Hair loss Nausea Unusual weakness or fatigue Vomiting This list may not describe all possible side effects. Call your doctor for medical advice about side effects. You may report side effects to FDA at 1-800-FDA-1088. Where should I keep my medication? This medication is given in a hospital or clinic. It will not be stored at home. NOTE: This sheet is a summary. It may not cover all possible information. If  you have questions about this medicine, talk to your doctor, pharmacist, or health care provider.  2024 Elsevier/Gold Standard (2021-05-01 00:00:00)  Etoposide  Injection What is this medication? ETOPOSIDE  (e toe POE side) treats some types of cancer. It works by slowing down the growth of cancer cells. This medicine may be used for other purposes; ask your health care provider or pharmacist if you have questions. COMMON BRAND NAME(S): Etopophos, Toposar , VePesid  What should I tell my care team before I take this medication? They need to know if you have any of these conditions: Infection Kidney disease Liver disease Low blood counts, such as low white cell, platelet, red cell counts An unusual or allergic reaction to etoposide , other medications, foods, dyes, or  preservatives If you or your partner are pregnant or trying to get pregnant Breastfeeding How should I use this medication? This medication is injected into a vein. It is given by your care team in a hospital or clinic setting. Talk to your care team about the use of this medication in children. Special care may be needed. Overdosage: If you think you have taken too much of this medicine contact a poison control center or emergency room at once. NOTE: This medicine is only for you. Do not share this medicine with others. What if I miss a dose? Keep appointments for follow-up doses. It is important not to miss your dose. Call your care team if you are unable to keep an appointment. What may interact with this medication? Warfarin This list may not describe all possible interactions. Give your health care provider a list of all the medicines, herbs, non-prescription drugs, or dietary supplements you use. Also tell them if you smoke, drink alcohol, or use illegal drugs. Some items may interact with your medicine. What should I watch for while using this medication? Your condition will be monitored carefully while you are receiving this medication. This medication may make you feel generally unwell. This is not uncommon as chemotherapy can affect healthy cells as well as cancer cells. Report any side effects. Continue your course of treatment even though you feel ill unless your care team tells you to stop. This medication can cause serious side effects. To reduce the risk, your care team may give you other medications to take before receiving this one. Be sure to follow the directions from your care team. This medication may increase your risk of getting an infection. Call your care team for advice if you get a fever, chills, sore throat, or other symptoms of a cold or flu. Do not treat yourself. Try to avoid being around people who are sick. This medication may increase your risk to bruise or bleed.  Call your care team if you notice any unusual bleeding. Talk to your care team about your risk of cancer. You may be more at risk for certain types of cancers if you take this medication. Talk to your care team if you may be pregnant. Serious birth defects can occur if you take this medication during pregnancy and for 6 months after the last dose. You will need a negative pregnancy test before starting this medication. Contraception is recommended while taking this medication and for 6 months after the last dose. Your care team can help you find the option that works for you. If your partner can get pregnant, use a condom during sex while taking this medication and for 4 months after the last dose. Do not breastfeed while taking this medication. This medication may cause  infertility. Talk to your care team if you are concerned about your fertility. What side effects may I notice from receiving this medication? Side effects that you should report to your care team as soon as possible: Allergic reactions--skin rash, itching, hives, swelling of the face, lips, tongue, or throat Infection--fever, chills, cough, sore throat, wounds that don't heal, pain or trouble when passing urine, general feeling of discomfort or being unwell Low red blood cell level--unusual weakness or fatigue, dizziness, headache, trouble breathing Unusual bruising or bleeding Side effects that usually do not require medical attention (report to your care team if they continue or are bothersome): Diarrhea Fatigue Hair loss Loss of appetite Nausea Vomiting This list may not describe all possible side effects. Call your doctor for medical advice about side effects. You may report side effects to FDA at 1-800-FDA-1088. Where should I keep my medication? This medication is given in a hospital or clinic. It will not be stored at home. NOTE: This sheet is a summary. It may not cover all possible information. If you have questions  about this medicine, talk to your doctor, pharmacist, or health care provider.  2024 Elsevier/Gold Standard (2021-05-31 00:00:00)

## 2023-06-10 ENCOUNTER — Ambulatory Visit: Payer: PRIVATE HEALTH INSURANCE

## 2023-06-10 ENCOUNTER — Ambulatory Visit
Admission: RE | Admit: 2023-06-10 | Discharge: 2023-06-10 | Disposition: A | Discharge status: Home / Self Care | Payer: PRIVATE HEALTH INSURANCE | Source: Ambulatory Visit | Attending: Radiation Oncology | Admitting: Radiation Oncology

## 2023-06-10 ENCOUNTER — Inpatient Hospital Stay: Payer: PRIVATE HEALTH INSURANCE

## 2023-06-10 ENCOUNTER — Other Ambulatory Visit: Payer: Self-pay

## 2023-06-10 VITALS — BP 132/73 | HR 88 | Temp 97.5°F | Resp 16

## 2023-06-10 DIAGNOSIS — Z5111 Encounter for antineoplastic chemotherapy: Secondary | ICD-10-CM | POA: Diagnosis not present

## 2023-06-10 DIAGNOSIS — C3411 Malignant neoplasm of upper lobe, right bronchus or lung: Secondary | ICD-10-CM

## 2023-06-10 LAB — RAD ONC ARIA SESSION SUMMARY
Course Elapsed Days: 55
Plan Fractions Treated to Date: 9
Plan Prescribed Dose Per Fraction: 2 Gy
Plan Total Fractions Prescribed: 17
Plan Total Prescribed Dose: 34 Gy
Reference Point Dosage Given to Date: 54 Gy
Reference Point Session Dosage Given: 2 Gy
Session Number: 27

## 2023-06-10 MED ORDER — TRILACICLIB DIHYDROCHLORIDE INJECTION 300 MG
240.0000 mg/m2 | Freq: Once | INTRAVENOUS | Status: DC
  Filled 2023-06-10: qty 32

## 2023-06-10 MED ORDER — DEXAMETHASONE SODIUM PHOSPHATE 10 MG/ML IJ SOLN
10.0000 mg | Freq: Once | INTRAMUSCULAR | Status: AC
  Administered 2023-06-10: 10 mg via INTRAVENOUS
  Filled 2023-06-10: qty 1

## 2023-06-10 MED ORDER — HEPARIN SOD (PORK) LOCK FLUSH 100 UNIT/ML IV SOLN
500.0000 [IU] | Freq: Once | INTRAVENOUS | Status: AC | PRN
Start: 2023-06-10 — End: 2023-06-10
  Administered 2023-06-10: 500 [IU]

## 2023-06-10 MED ORDER — SODIUM CHLORIDE 0.9 % IV SOLN
100.0000 mg/m2 | Freq: Once | INTRAVENOUS | Status: AC
  Administered 2023-06-10: 200 mg via INTRAVENOUS
  Filled 2023-06-10: qty 10

## 2023-06-10 MED ORDER — SODIUM CHLORIDE 0.9 % IV SOLN: INTRAVENOUS | Status: DC

## 2023-06-10 MED ORDER — TRILACICLIB DIHYDROCHLORIDE INJECTION 300 MG
240.0000 mg/m2 | Freq: Once | INTRAVENOUS | Status: AC
Start: 2023-06-10 — End: 2023-06-10
  Administered 2023-06-10: 480 mg via INTRAVENOUS
  Filled 2023-06-10: qty 32

## 2023-06-10 MED ORDER — SODIUM CHLORIDE 0.9% FLUSH
10.0000 mL | INTRAVENOUS | Status: DC | PRN
  Administered 2023-06-10: 10 mL

## 2023-06-10 NOTE — Patient Instructions (Signed)
 [facility] - A DEPT OF [facility]  Discharge Instructions: Thank you for choosing [facility] to provide your oncology and hematology care.   If you have a lab appointment with the Cancer Center, please go directly to the Cancer Center and check in at the registration area.   Wear comfortable clothing and clothing appropriate for easy access to any Portacath or PICC line.   We strive to give you quality time with your provider. You may need to reschedule your appointment if you arrive late (15 or more minutes).  Arriving late affects you and other patients whose appointments are after yours.  Also, if you miss three or more appointments without notifying the office, you may be dismissed from the clinic at the provider's discretion.      For prescription refill requests, have your pharmacy contact our office and allow 72 hours for refills to be completed.    Today you received the following chemotherapy and/or immunotherapy agents Etoposide  and Cosela       To help prevent nausea and vomiting after your treatment, we encourage you to take your nausea medication as directed.  BELOW ARE SYMPTOMS THAT SHOULD BE REPORTED IMMEDIATELY: *FEVER GREATER THAN 100.4 F (38 C) OR HIGHER *CHILLS OR SWEATING *NAUSEA AND VOMITING THAT IS NOT CONTROLLED WITH YOUR NAUSEA MEDICATION *UNUSUAL SHORTNESS OF BREATH *UNUSUAL BRUISING OR BLEEDING *URINARY PROBLEMS (pain or burning when urinating, or frequent urination) *BOWEL PROBLEMS (unusual diarrhea, constipation, pain near the anus) TENDERNESS IN MOUTH AND THROAT WITH OR WITHOUT PRESENCE OF ULCERS (sore throat, sores in mouth, or a toothache) UNUSUAL RASH, SWELLING OR PAIN  UNUSUAL VAGINAL DISCHARGE OR ITCHING   Items with * indicate a potential emergency and should be followed up as soon as possible or go to the Emergency Department if any problems should occur.  Please show the CHEMOTHERAPY ALERT CARD or  IMMUNOTHERAPY ALERT CARD at check-in to the Emergency Department and triage nurse.  Should you have questions after your visit or need to cancel or reschedule your appointment, please contact [facility] - A DEPT OF [facility]  Dept: [phone]  and follow the prompts.  Office hours are 8:00 a.m. to 4:30 p.m. Monday - Friday. Please note that voicemails left after 4:00 p.m. may not be returned until the following business day.  We are closed weekends and major holidays. You have access to a nurse at all times for urgent questions. Please call the main number to the clinic Dept: [phone] and follow the prompts.   For any non-urgent questions, you may also contact your provider using MyChart. We now offer e-Visits for anyone 26 and older to request care online for non-urgent symptoms. For details visit [email or web address].   Also download the MyChart app! Go to the app store, search "MyChart", open the app, select [facility], and log in with your MyChart username and password.

## 2023-06-11 ENCOUNTER — Inpatient Hospital Stay: Payer: PRIVATE HEALTH INSURANCE

## 2023-06-11 ENCOUNTER — Ambulatory Visit: Payer: PRIVATE HEALTH INSURANCE

## 2023-06-11 VITALS — BP 126/71 | Temp 98.0°F | Resp 17

## 2023-06-11 DIAGNOSIS — Z5111 Encounter for antineoplastic chemotherapy: Secondary | ICD-10-CM | POA: Diagnosis not present

## 2023-06-11 DIAGNOSIS — C3411 Malignant neoplasm of upper lobe, right bronchus or lung: Secondary | ICD-10-CM

## 2023-06-11 MED ORDER — TRILACICLIB DIHYDROCHLORIDE INJECTION 300 MG
240.0000 mg/m2 | Freq: Once | INTRAVENOUS | Status: AC
  Administered 2023-06-11: 480 mg via INTRAVENOUS
  Filled 2023-06-11: qty 32

## 2023-06-11 MED ORDER — HEPARIN SOD (PORK) LOCK FLUSH 100 UNIT/ML IV SOLN
500.0000 [IU] | Freq: Once | INTRAVENOUS | Status: AC | PRN
  Administered 2023-06-11: 500 [IU]

## 2023-06-11 MED ORDER — SODIUM CHLORIDE 0.9 % IV SOLN: INTRAVENOUS | Status: DC

## 2023-06-11 MED ORDER — SODIUM CHLORIDE 0.9% FLUSH
10.0000 mL | INTRAVENOUS | Status: DC | PRN
  Administered 2023-06-11: 10 mL

## 2023-06-11 MED ORDER — SODIUM CHLORIDE 0.9 % IV SOLN
100.0000 mg/m2 | Freq: Once | INTRAVENOUS | Status: AC
  Administered 2023-06-11: 200 mg via INTRAVENOUS
  Filled 2023-06-11: qty 10

## 2023-06-11 MED ORDER — DEXAMETHASONE SODIUM PHOSPHATE 10 MG/ML IJ SOLN
10.0000 mg | Freq: Once | INTRAMUSCULAR | Status: AC
  Administered 2023-06-11: 10 mg via INTRAVENOUS
  Filled 2023-06-11: qty 1

## 2023-06-11 NOTE — Patient Instructions (Signed)
 [facility] DE[facility]  Discharge Instructions: Thank you for choosing [facility] to provide your oncology and hematology care.   If you have a lab appointment with the Cancer Center, please go directly to the Cancer Center and check in at the registration area.   Wear comfortable clothing and clothing appropriate for easy access to any Portacath or PICC line.   We strive to give you quality time with your provider. You may need to reschedule your appointment if you arrive late (15 or more minutes).  Arriving late affects you and other patients whose appointments are after yours.  Also, if you miss three or more appointments without notifying the office, you may be dismissed from the clinic at the provider's discretion.      For prescription refill requests, have your pharmacy contact our office and allow 72 hours for refills to be completed.    Today you received the following chemotherapy and/or immunotherapy agents: Cosela, Etoposide.       To help prevent nausea and vomiting after your treatment, we encourage you to take your nausea medication as directed.  BELOW ARE SYMPTOMS THAT SHOULD BE REPORTED IMMEDIATELY: *FEVER GREATER THAN 100.4 F (38 C) OR HIGHER *CHILLS OR SWEATING *NAUSEA AND VOMITING THAT IS NOT CONTROLLED WITH YOUR NAUSEA MEDICATION *UNUSUAL SHORTNESS OF BREATH *UNUSUAL BRUISING OR BLEEDING *URINARY PROBLEMS (pain or burning when urinating, or frequent urination) *BOWEL PROBLEMS (unusual diarrhea, constipation, pain near the anus) TENDERNESS IN MOUTH AND THROAT WITH OR WITHOUT PRESENCE OF ULCERS (sore throat, sores in mouth, or a toothache) UNUSUAL RASH, SWELLING OR PAIN  UNUSUAL VAGINAL DISCHARGE OR ITCHING   Items with * indicate a potential emergency and should be followed up as soon as possible or go to the Emergency Department if any problems should occur.  Please show the CHEMOTHERAPY ALERT CARD or  IMMUNOTHERAPY ALERT CARD at check-in to the Emergency Department and triage nurse.  Should you have questions after your visit or need to cancel or reschedule your appointment, please contact [facility] - A DEPT OF [facility]  Dept: [phone]  and follow the prompts.  Office hours are 8:00 a.m. to 4:30 p.m. Monday - Friday. Please note that voicemails left after 4:00 p.m. may not be returned until the following business day.  We are closed weekends and major holidays. You have access to a nurse at all times for urgent questions. Please call the main number to the clinic Dept: [phone] and follow the prompts.   For any non-urgent questions, you may also contact your provider using MyChart. We now offer e-Visits for anyone 51 and older to request care online for non-urgent symptoms. For details visit [email or web address].   Also download the MyChart app! Go to the app store, search "MyChart", open the app, select [facility], and log in with your MyChart username and password.

## 2023-06-12 ENCOUNTER — Ambulatory Visit: Payer: PRIVATE HEALTH INSURANCE

## 2023-06-12 ENCOUNTER — Ambulatory Visit
Admission: RE | Admit: 2023-06-12 | Discharge: 2023-06-12 | Disposition: A | Discharge status: Home / Self Care | Payer: PRIVATE HEALTH INSURANCE | Source: Ambulatory Visit | Attending: Radiation Oncology | Admitting: Radiation Oncology

## 2023-06-12 ENCOUNTER — Ambulatory Visit (INDEPENDENT_AMBULATORY_CARE_PROVIDER_SITE_OTHER): Payer: PRIVATE HEALTH INSURANCE | Admitting: Emergency Medicine

## 2023-06-12 ENCOUNTER — Other Ambulatory Visit: Payer: Self-pay

## 2023-06-12 ENCOUNTER — Encounter: Payer: Self-pay | Admitting: Emergency Medicine

## 2023-06-12 VITALS — BP 122/78 | HR 90 | Ht 72.0 in | Wt 181.0 lb

## 2023-06-12 DIAGNOSIS — J449 Chronic obstructive pulmonary disease, unspecified: Secondary | ICD-10-CM | POA: Diagnosis not present

## 2023-06-12 DIAGNOSIS — Z720 Tobacco use: Secondary | ICD-10-CM | POA: Insufficient documentation

## 2023-06-12 DIAGNOSIS — F17210 Nicotine dependence, cigarettes, uncomplicated: Secondary | ICD-10-CM | POA: Diagnosis not present

## 2023-06-12 DIAGNOSIS — C3411 Malignant neoplasm of upper lobe, right bronchus or lung: Secondary | ICD-10-CM | POA: Diagnosis not present

## 2023-06-12 DIAGNOSIS — Z5111 Encounter for antineoplastic chemotherapy: Secondary | ICD-10-CM | POA: Diagnosis not present

## 2023-06-12 LAB — RAD ONC ARIA SESSION SUMMARY
Course Elapsed Days: 57
Plan Fractions Treated to Date: 10
Plan Prescribed Dose Per Fraction: 2 Gy
Plan Total Fractions Prescribed: 17
Plan Total Prescribed Dose: 34 Gy
Reference Point Dosage Given to Date: 56 Gy
Reference Point Session Dosage Given: 2 Gy
Session Number: 28

## 2023-06-12 MED ORDER — ALBUTEROL SULFATE HFA 108 (90 BASE) MCG/ACT IN AERS: 2.0000 | INHALATION_SPRAY | Freq: Four times a day (QID) | RESPIRATORY_TRACT | 6 refills | Status: AC | PRN

## 2023-06-12 MED ORDER — STIOLTO RESPIMAT 2.5-2.5 MCG/ACT IN AERS: 2.0000 | INHALATION_SPRAY | Freq: Every day | RESPIRATORY_TRACT | 11 refills | Status: AC

## 2023-06-12 NOTE — Assessment & Plan Note (Signed)
 Presumed COPD given his tobacco history.  He will ultimately need pulmonary function testing but I think we should do a trial of Stiolto now to see if he gets benefit.

## 2023-06-12 NOTE — Progress Notes (Signed)
 Subjective:    Patient ID: Eric Foley, male    DOB: Sep 17, 1970, 53 y.o.   MRN: [id number]  HPI Mr. Molina is a 53 year old gentleman with a history of tobacco use (80 pack years), COPD.  I met him when he was evaluated for facial and right arm swelling, hemoptysis, SVC syndrome.  He was found to have a right upper lobe mass and started urgent XRT.  We performed bronchoscopy on 04/22/2023 to get a definitive tissue diagnosis.  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  He has started chemoradiation - 2 more chemo and 10 more XRT treatments. Not currently on any bronchodilator therapy. Today he describes that he is feeling quite well. Occasional nausea. He has not noticed any weakness or fatigue. He does have occasional exertional SOB. Has cut cigarettes down to 10 / day. He has some cough w clear mucous.     Review of Systems As per HPI  Past Medical History:  Diagnosis Date   COPD (chronic obstructive pulmonary disease) (HCC)    History of kidney stones      No family history on file.   Social History   Socioeconomic History   Marital status: Single    Spouse name: Not on file   Number of children: Not on file   Years of education: Not on file   Highest education level: Not on file  Occupational History   Not on file  Tobacco Use   Smoking status: Every Day    Types: Cigarettes   Smokeless tobacco: Never   Tobacco comments:    1/2 pk daily 06/12/2023  Vaping Use   Vaping status: Never Used  Substance and Sexual Activity   Alcohol use: No   Drug use: No   Sexual activity: Not on file  Other Topics Concern   Not on file  Social History Narrative   Not on file   Social Drivers of Health   Financial Resource Strain: Not on file  Food Insecurity: Not on file  Transportation Needs: Not on file  Physical Activity: Not on file  Stress: Not on file  Social Connections: Unknown (06/04/2021)    Received from [facility]   Social Network    Social Network: Not on file  Intimate Partner Violence: Unknown (04/26/2021)   Received from [facility]   HITS    Physically Hurt: Not on file    Insult or Talk Down To: Not on file    Threaten Physical Harm: Not on file    Scream or Curse: Not on file     Allergies  Allergen Reactions   Penicillins Anaphylaxis, Rash and Swelling   Amoxicillin Rash   Ampicillin-Sulbactam Sodium Rash     Outpatient Medications Prior to Visit  Medication Sig Dispense Refill   Aspirin-Acetaminophen (GOODYS BODY PAIN PO) Take by mouth 2 (two) times daily.     Potassium 99 MG TABS Take 99 mg by mouth 2 (two) times daily.     prochlorperazine  (COMPAZINE ) 10 MG tablet Take 1 tablet (10 mg total) by mouth every 6 (six) hours as needed. 30 tablet 2   lidocaine -prilocaine  (EMLA ) cream Apply 1 Application topically as needed. (Patient not taking: Reported on 06/12/2023) 30 g 2   No facility-administered medications prior to visit.        Objective:   Physical Exam  Vitals:   06/12/23 1321  BP: 122/78  Pulse: 90  SpO2: 96%  Weight: 181 lb (82.1 kg)  Height: 6' (1.829 m)   Gen: Pleasant, well-nourished, in no distress,  normal affect  ENT: No lesions,  mouth clear,  oropharynx clear, no postnasal drip  Neck: No JVD, no stridor  Lungs: No use of accessory muscles, no crackles or wheezing on normal respiration, no wheeze on forced expiration  Cardiovascular: RRR, heart sounds normal, no murmur or gallops, no peripheral edema  Musculoskeletal: No deformities, no cyanosis or clubbing  Neuro: alert, awake, non focal  Skin: Warm, no lesions or rash      Assessment & Plan:  Small cell lung cancer (HCC) Diagnosed by bronchoscopy.  He is feeling very well, minimal side effects from his chemoradiation.  Good energy.  Occasional nausea.  He is finishing chemoradiation and then will have surveillance imaging as per oncology plans.  COPD (chronic  obstructive pulmonary disease) (HCC) Presumed COPD given his tobacco history.  He will ultimately need pulmonary function testing but I think we should do a trial of Stiolto now to see if he gets benefit.  Tobacco use He has cut down significantly.  Congratulated him on this.  He will continue to try to decrease and ultimately we will work on setting a quit date.   Racheal Buddle, MD, PhD 06/12/2023, 2:04 PM [facility] [phone] or if no answer before 7:00PM call [phone] For any issues after 7:00PM please call eLink [phone]

## 2023-06-12 NOTE — Assessment & Plan Note (Signed)
 Diagnosed by bronchoscopy.  He is feeling very well, minimal side effects from his chemoradiation.  Good energy.  Occasional nausea.  He is finishing chemoradiation and then will have surveillance imaging as per oncology plans.

## 2023-06-12 NOTE — Patient Instructions (Signed)
 I am glad that you are feeling better Continue to follow with Radiation Oncology and Medical Oncology and get your treatments as planned. Get your surveillance chest imaging as per oncology plans We will try starting Stiolto 2 puffs once daily.  Keep track of how this medication helps your breathing.  If you benefit then we will continue it going forward. Will give you a prescription for albuterol.  You can use 2 puffs up to every 4 hours if needed for shortness of breath, chest tightness, wheezing. Congratulations on decreasing your cigarettes.  Ultimate goal will be to stop altogether. Please follow with Dr. Baldwin Levee in 6 months.  At that time we will review how you are doing on the [facility] and also discussed timing of possible future pulmonary function testing.

## 2023-06-12 NOTE — Assessment & Plan Note (Signed)
 He has cut down significantly.  Congratulated him on this.  He will continue to try to decrease and ultimately we will work on setting a quit date.

## 2023-06-13 ENCOUNTER — Ambulatory Visit: Payer: PRIVATE HEALTH INSURANCE

## 2023-06-13 ENCOUNTER — Telehealth: Payer: Self-pay

## 2023-06-13 NOTE — Telephone Encounter (Signed)
 Copied from CRM [id number]. Topic: Clinical - Prescription Issue >> Jun 13, 2023  9:01 AM Margarette Shawl wrote: Reason for CRM:   Pt is requesting alternative medications for:  albuterol (VENTOLIN HFA) 108 (90 Base) MCG/ACT inhaler  Tiotropium Bromide-Olodaterol (STIOLTO RESPIMAT) 2.5-2.5 MCG/ACT AERS  His copay for these medications is $ 578. He is requesting the alternative meds be called into   [id number] - [place] ([place] - [address], [place] ([place]) [place] [address] Phone: [phone]  Fax: [phone]  CB#[phone]  Please advise of cheaper alternative for Albuterol inhaler

## 2023-06-16 ENCOUNTER — Ambulatory Visit: Payer: PRIVATE HEALTH INSURANCE

## 2023-06-17 ENCOUNTER — Encounter: Payer: Self-pay | Admitting: Internal Medicine

## 2023-06-17 ENCOUNTER — Inpatient Hospital Stay: Payer: PRIVATE HEALTH INSURANCE

## 2023-06-17 ENCOUNTER — Ambulatory Visit: Payer: PRIVATE HEALTH INSURANCE

## 2023-06-17 ENCOUNTER — Telehealth: Payer: Self-pay

## 2023-06-17 ENCOUNTER — Other Ambulatory Visit (HOSPITAL_COMMUNITY): Payer: Self-pay

## 2023-06-17 NOTE — Telephone Encounter (Signed)
 Patients insurance is showing as "patient not found" please have patient confirm coverage.

## 2023-06-17 NOTE — Telephone Encounter (Signed)
 Pending confirmation of patient insurance- card on file showing "patient not found". Message sent to office in original request.

## 2023-06-18 ENCOUNTER — Ambulatory Visit: Payer: PRIVATE HEALTH INSURANCE

## 2023-06-18 ENCOUNTER — Inpatient Hospital Stay: Payer: PRIVATE HEALTH INSURANCE

## 2023-06-18 ENCOUNTER — Telehealth: Payer: Self-pay | Admitting: Radiation Oncology

## 2023-06-18 NOTE — Telephone Encounter (Signed)
 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 Pt was advised treatment team would be notified. He was also advised about MedOnc appt, pt states he LVM for MedOnc about needing to miss appt as well.

## 2023-06-19 ENCOUNTER — Other Ambulatory Visit: Payer: PRIVATE HEALTH INSURANCE

## 2023-06-19 ENCOUNTER — Telehealth: Payer: Self-pay | Admitting: Internal Medicine

## 2023-06-19 ENCOUNTER — Other Ambulatory Visit: Payer: Self-pay

## 2023-06-19 ENCOUNTER — Encounter: Payer: Self-pay | Admitting: Internal Medicine

## 2023-06-19 ENCOUNTER — Telehealth: Payer: Self-pay

## 2023-06-19 ENCOUNTER — Inpatient Hospital Stay: Payer: PRIVATE HEALTH INSURANCE

## 2023-06-19 ENCOUNTER — Other Ambulatory Visit (HOSPITAL_COMMUNITY): Payer: Self-pay

## 2023-06-19 ENCOUNTER — Ambulatory Visit
Admission: RE | Admit: 2023-06-19 | Discharge: 2023-06-19 | Discharge status: Home / Self Care | Payer: PRIVATE HEALTH INSURANCE | Source: Ambulatory Visit | Attending: Radiation Oncology

## 2023-06-19 ENCOUNTER — Ambulatory Visit
Admission: RE | Admit: 2023-06-19 | Discharge: 2023-06-19 | Disposition: A | Discharge status: Home / Self Care | Payer: PRIVATE HEALTH INSURANCE | Source: Ambulatory Visit | Attending: Radiation Oncology | Admitting: Radiation Oncology

## 2023-06-19 ENCOUNTER — Ambulatory Visit: Payer: PRIVATE HEALTH INSURANCE

## 2023-06-19 DIAGNOSIS — Z5111 Encounter for antineoplastic chemotherapy: Secondary | ICD-10-CM | POA: Diagnosis not present

## 2023-06-19 DIAGNOSIS — Z95828 Presence of other vascular implants and grafts: Secondary | ICD-10-CM

## 2023-06-19 DIAGNOSIS — C3490 Malignant neoplasm of unspecified part of unspecified bronchus or lung: Secondary | ICD-10-CM

## 2023-06-19 LAB — CBC WITH DIFFERENTIAL (CANCER CENTER ONLY)
Abs Immature Granulocytes: 0.01 10*3/uL (ref 0.00–0.07)
Basophils Absolute: 0 10*3/uL (ref 0.0–0.1)
Basophils Relative: 1 %
Eosinophils Absolute: 0.1 10*3/uL (ref 0.0–0.5)
Eosinophils Relative: 3 %
HCT: 31.1 % — ABNORMAL LOW (ref 39.0–52.0)
Hemoglobin: 10.8 g/dL — ABNORMAL LOW (ref 13.0–17.0)
Immature Granulocytes: 1 %
Lymphocytes Relative: 9 %
Lymphs Abs: 0.2 10*3/uL — ABNORMAL LOW (ref 0.7–4.0)
MCH: 32 pg (ref 26.0–34.0)
MCHC: 34.7 g/dL (ref 30.0–36.0)
MCV: 92.3 fL (ref 80.0–100.0)
Monocytes Absolute: 0.5 10*3/uL (ref 0.1–1.0)
Monocytes Relative: 21 %
Neutro Abs: 1.5 10*3/uL — ABNORMAL LOW (ref 1.7–7.7)
Neutrophils Relative %: 65 %
Platelet Count: 119 10*3/uL — ABNORMAL LOW (ref 150–400)
RBC: 3.37 MIL/uL — ABNORMAL LOW (ref 4.22–5.81)
RDW: 15.9 % — ABNORMAL HIGH (ref 11.5–15.5)
WBC Count: 2.2 10*3/uL — ABNORMAL LOW (ref 4.0–10.5)
nRBC: 0 % (ref 0.0–0.2)

## 2023-06-19 LAB — CMP (CANCER CENTER ONLY)
ALT: 15 U/L (ref 0–44)
AST: 14 U/L — ABNORMAL LOW (ref 15–41)
Albumin: 4 g/dL (ref 3.5–5.0)
Alkaline Phosphatase: 65 U/L (ref 38–126)
Anion gap: 4 — ABNORMAL LOW (ref 5–15)
BUN: 16 mg/dL (ref 6–20)
CO2: 30 mmol/L (ref 22–32)
Calcium: 9.1 mg/dL (ref 8.9–10.3)
Chloride: 105 mmol/L (ref 98–111)
Creatinine: 0.92 mg/dL (ref 0.61–1.24)
GFR, Estimated: >60 mL/min (ref >60)
Glucose, Bld: 139 mg/dL — ABNORMAL HIGH (ref 70–99)
Potassium: 4 mmol/L (ref 3.5–5.1)
Sodium: 139 mmol/L (ref 135–145)
Total Bilirubin: 0.4 mg/dL (ref 0.0–1.2)
Total Protein: 6.5 g/dL (ref 6.5–8.1)

## 2023-06-19 LAB — RAD ONC ARIA SESSION SUMMARY
Course Elapsed Days: 64
Plan Fractions Treated to Date: 11
Plan Prescribed Dose Per Fraction: 2 Gy
Plan Total Fractions Prescribed: 17
Plan Total Prescribed Dose: 34 Gy
Reference Point Dosage Given to Date: 58 Gy
Reference Point Session Dosage Given: 2 Gy
Session Number: 29

## 2023-06-19 MED ORDER — HEPARIN SOD (PORK) LOCK FLUSH 100 UNIT/ML IV SOLN
500.0000 [IU] | Freq: Once | INTRAVENOUS | Status: AC
  Administered 2023-06-19: 500 [IU]

## 2023-06-19 MED ORDER — SODIUM CHLORIDE 0.9% FLUSH
10.0000 mL | Freq: Once | INTRAVENOUS | Status: AC
  Administered 2023-06-19: 10 mL

## 2023-06-19 NOTE — Telephone Encounter (Signed)
 No response from patient/provider. Closing request until new ins sent in.

## 2023-06-19 NOTE — Telephone Encounter (Signed)
 Copied from CRM [id number]. Topic: Clinical - Prescription Issue >> Jun 19, 2023 12:13 PM Ilean Mall F wrote: Reason for CRM: Pt is requesting alternative medications for:   albuterol (VENTOLIN HFA) 108 (90 Base) MCG/ACT inhaler   Tiotropium Bromide-Olodaterol (STIOLTO RESPIMAT) 2.5-2.5 MCG/ACT AERS   His copay for these medications is $ 578. He is requesting the alternative meds be called into    [facility] - [place] ([place]), [place] - [address], [place] ([place]) [place] [address] Phone: [phone]  Fax: [phone]  CB#[phone]  I checked with the pt and he stated he still has [employer] that is currently active. Pt would like a call back at [phone] ok to leave a vm.     Samples unavailable at time of OV - waiting on PT to give updated information. NFN

## 2023-06-19 NOTE — Telephone Encounter (Signed)
 Patient left a voicemail explaining that he would not be able to make his appointment yesterday and asking to reschedule for today. I left the patient a voicemail with rescheduled appointment details and to call me back if the time does not work for him.

## 2023-06-19 NOTE — Telephone Encounter (Signed)
 Called and spoke to patients pharmacy- they have not been provided with patient insurance so these prices reflect a [employer] card.    Called patient and he does not know his pharmacy benefits information but has reached out to his plan to get this information. Patient BCBS card on file is no longer valid as patient has cancelled this plan.    Advised patient that we cannot move forward until the correct pharmacy benefits is provided.    Patient stated multiple times that he did not understand why he did not get a sample of the Stiolto so he could see if it would even work.    Closing request

## 2023-06-20 ENCOUNTER — Ambulatory Visit: Payer: PRIVATE HEALTH INSURANCE

## 2023-06-23 ENCOUNTER — Ambulatory Visit

## 2023-06-23 ENCOUNTER — Other Ambulatory Visit

## 2023-06-23 ENCOUNTER — Ambulatory Visit: Payer: PRIVATE HEALTH INSURANCE

## 2023-06-23 ENCOUNTER — Ambulatory Visit: Admitting: Internal Medicine

## 2023-06-23 DIAGNOSIS — Z510 Encounter for antineoplastic radiation therapy: Secondary | ICD-10-CM | POA: Insufficient documentation

## 2023-06-23 DIAGNOSIS — C3411 Malignant neoplasm of upper lobe, right bronchus or lung: Secondary | ICD-10-CM | POA: Insufficient documentation

## 2023-06-24 ENCOUNTER — Ambulatory Visit: Payer: PRIVATE HEALTH INSURANCE

## 2023-06-24 ENCOUNTER — Ambulatory Visit (HOSPITAL_COMMUNITY): Payer: PRIVATE HEALTH INSURANCE | Attending: Physician Assistant

## 2023-06-24 ENCOUNTER — Inpatient Hospital Stay: Payer: PRIVATE HEALTH INSURANCE

## 2023-06-24 ENCOUNTER — Ambulatory Visit

## 2023-06-24 NOTE — Progress Notes (Signed)
 [facility] OFFICE PROGRESS NOTE  Patient, No Pcp Per No address on file  DIAGNOSIS: Extensive stage Small cell lung cancer. He presented with a large right upper lobe lung mass with mediastinal invasion, right pleural/subpleural involvement, left supraclavicular adenopathy, and possible pelvic peritoneal nodule. He was diagnosed in March 2025.   PRIOR THERAPY: None  CURRENT THERAPY: 1) palliative systemic chemotherapy with carboplatin  for an AUC of 5 and etoposide  100 mg/m on days 1, 2, and 3 IV every 3 weeks and Imfinzi once he completes his radiation to the chest. First dose on 05/12/23. He also receives Cosela  for myeloprotection.  2) radiation to the large right lung mass and pelvis under the care of Dr. Lorri Rota last dose on 07/07/23  INTERVAL HISTORY: Eric Foley 53 y.o. male returns to the clinic today for a follow-up visit. The patient was last seen 3 weeks ago. The patient is currently undergoing chemotherapy for his lung cancer.  He is status post 2 cycles of treatment he is tolerating it well.  He also completed his radiation in the interval since last being seen. Therefore from cycle #3, we will add on his immunotherapy with Imfinzi.  He was supposed to have a restaging CT scan but he missed his appointment because the patient got a new job and he is not wanting to miss work.  The patient has some neutropenia today with an ANC of 1.3.  Patient denies any signs and symptoms of infection at this time.  After discussion about the patient's labs being borderline for treatment today, he prefers to delay treatment by 1 week instead of running the risk of infection and having to miss work.  Patient is working 70 hours/week.  No fevers, chills, night sweats, or significant weight loss. He has lost a couple of pounds but attributes this to eating habits. He continues to drink two Ensures daily.  He mentions a previous recommendation for two inhalers by pulmonary medicine  but has not obtained them due to their high cost. He has reached out for alternatives but has not received a response yet although I do see a telephone encounter in the chart with a response.   No changes in shortness of breath, cough, swelling, hemoptysis, chest pain, nausea, vomiting, diarrhea, constipation, headaches, or vision changes. His breathing has improved since starting treatment.  He notes that his tumor has shrunk to about 3.75 cm according to recent radiation scans.   He is here today for evaluation to review his scan results.   MEDICAL HISTORY: Past Medical History:  Diagnosis Date   COPD (chronic obstructive pulmonary disease) (HCC)    History of kidney stones     ALLERGIES:  is allergic to penicillins, amoxicillin, and ampicillin-sulbactam sodium.  MEDICATIONS:  Current Outpatient Medications  Medication Sig Dispense Refill   Aspirin-Acetaminophen (GOODYS BODY PAIN PO) Take by mouth 2 (two) times daily.     Potassium 99 MG TABS Take 99 mg by mouth 2 (two) times daily.     prochlorperazine  (COMPAZINE ) 10 MG tablet Take 1 tablet (10 mg total) by mouth every 6 (six) hours as needed. 30 tablet 2   albuterol  (VENTOLIN  HFA) 108 (90 Base) MCG/ACT inhaler Inhale 2 puffs into the lungs every 6 (six) hours as needed for wheezing or shortness of breath. (Patient not taking: Reported on 06/30/2023) 8 g 6   lidocaine -prilocaine  (EMLA ) cream Apply 1 Application topically as needed. (Patient not taking: Reported on 06/30/2023) 30 g 2   Tiotropium Bromide-Olodaterol (  STIOLTO RESPIMAT ) 2.5-2.5 MCG/ACT AERS Inhale 2 puffs into the lungs daily. (Patient not taking: Reported on 06/30/2023) 4 g 11   No current facility-administered medications for this visit.    SURGICAL HISTORY:  Past Surgical History:  Procedure Laterality Date   BRONCHIAL BIOPSY  04/22/2023   Procedure: BRONCHOSCOPY, WITH BIOPSY;  Surgeon: Denson Flake, MD;  [facility] ENDOSCOPY;  Service: Pulmonary;;   BRONCHIAL  BRUSHINGS  04/22/2023   Procedure: BRONCHOSCOPY, WITH BRUSH BIOPSY;  Surgeon: Denson Flake, MD;  Location: [facility] ENDOSCOPY;  Service: Pulmonary;;   FLEXIBLE BRONCHOSCOPY  04/22/2023   Procedure: Cordella Deter;  Surgeon: Denson Flake, MD;  Location: [facility] ENDOSCOPY;  Service: Pulmonary;;   HEMOSTASIS CLIP PLACEMENT  04/22/2023   Procedure: CONTROL OF HEMORRHAGE, lung;  Surgeon: Denson Flake, MD;  Location: [facility] ENDOSCOPY;  Service: Pulmonary;;   IR IMAGING GUIDED PORT INSERTION  05/16/2023   TONSILLECTOMY      REVIEW OF SYSTEMS:   Review of Systems  Constitutional: Positive for intermittent stable fatigue. Negative for appetite change, chills,fever and unexpected weight change.  HENT: Negative for mouth sores, nosebleeds, sore throat and trouble swallowing.   Eyes: Negative for eye problems and icterus.  Respiratory: Improved shortness of breath. Baseline "normal" cough.  Cardiovascular: Negative for chest pain and leg swelling.  Gastrointestinal: Negative for abdominal pain, constipation, diarrhea,  and vomiting.  Genitourinary: Negative for bladder incontinence, difficulty urinating, dysuria, frequency and hematuria.   Musculoskeletal: Negative for back pain, gait problem, neck pain and neck stiffness.  Skin: Negative for itching and rash.  Neurological: Negative for dizziness, extremity weakness, gait problem, headaches, light-headedness and seizures.  Hematological: Negative for adenopathy. Does not bruise/bleed easily.  Psychiatric/Behavioral: Negative for confusion, depression and sleep disturbance. The patient is not nervous/anxious.       PHYSICAL EXAMINATION:  Blood pressure 123/76, pulse 84, temperature 98.5 F (36.9 C), temperature source Temporal, resp. rate 18, weight 178 lb 11.2 oz (81.1 kg), SpO2 98%.  ECOG PERFORMANCE STATUS: 1  Physical Exam  Constitutional: Oriented to person, place, and time and well-developed, well-nourished, and in no distress.  HENT:  Head:  Normocephalic and atraumatic.  Mouth/Throat: Oropharynx is clear and moist. No oropharyngeal exudate.  Eyes: Conjunctivae are normal. Right eye exhibits no discharge. Left eye exhibits no discharge. No scleral icterus.  Neck: Normal range of motion. Neck supple.  Cardiovascular: Normal rate, regular rhythm, normal heart sounds and intact distal pulses.   Pulmonary/Chest: Effort normal. Quiet breath sounds bilaterally. No respiratory distress. No wheezes. No rales.  Abdominal: Soft. Bowel sounds are normal. Exhibits no distension and no mass. There is no tenderness.  Musculoskeletal: Normal range of motion. Exhibits no edema.  Lymphadenopathy:    No cervical adenopathy.  Neurological: Alert and oriented to person, place, and time. Exhibits normal muscle tone. Gait normal. Coordination normal.  Skin: Skin is warm and dry. No rash noted. Not diaphoretic. No erythema. No pallor.  Psychiatric: Mood, memory and judgment normal.  Vitals reviewed.  LABORATORY DATA: Lab Results  Component Value Date   WBC 2.2 (L) 06/30/2023   HGB 11.1 (L) 06/30/2023   HCT 32.1 (L) 06/30/2023   MCV 97.0 06/30/2023   PLT 205 06/30/2023      Chemistry      Component Value Date/Time   NA 140 06/30/2023 1010   K 4.1 06/30/2023 1010   CL 108 06/30/2023 1010   CO2 28 06/30/2023 1010   BUN 12 06/30/2023 1010   CREATININE 0.74 06/30/2023  1010      Component Value Date/Time   CALCIUM 8.9 06/30/2023 1010   ALKPHOS 58 06/30/2023 1010   AST 15 06/30/2023 1010   ALT 13 06/30/2023 1010   BILITOT 0.4 06/30/2023 1010       RADIOGRAPHIC STUDIES:  No results found.   ASSESSMENT/PLAN:  This is a very pleasant 53 year old Caucasian male diagnosed with at least stage IIIC Small cell lung cancer (T4, N3, M0).  He presented with a large right upper lobe lung mass with mediastinal invasion, right pleural/subpleural involvement, left supraclavicular adenopathy, and possible pelvic peritoneal nodule.  He was diagnosed  in March 2025.   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    He started palliative  systemic chemotherapy with carboplatin  for an AUC of 5 day 1 and etoposide  100 mg/m on day 1, 2, and 3 on 05/12/23. He is status post 2 cycles.  Immunotherapy will be added starting from cycle #3 since he has completed his radiation.  Labs were reviewed. His total WBC is 2.2 and ANC 1.3.  Reviewed the patient's labs with Dr. Marguerita Shih.  Dr. Marguerita Shih will proceed with treatment today as scheduled with close monitoring.   However after discussion with the patient, the patient would prefer to defer treatment by 1 week because it is important to him to be able to maintain working and he is concerned about feeling weak or running the risk of infection causing him to miss work.  For we will defer his treatment plan by 1 week.  We will see him in 4 weeks for evaluation repeat blood work before undergoing cycle #4.  The patient missed his restaging CT scan.  However we will arrange for it to be performed after cycle #4.  I gave him the number to radiology scheduling to help facilitate having this scheduled in July. I will update the date on the order.   Radiation therapy for cancer SBRT ongoing to lung and pelvis through June 16th. Symptoms improved, indicating positive response. - Continue radiation therapy as scheduled through June 16th. - Coordinate radiation schedule with work schedule to ensure compliance.  He understands should he develop any signs or symptoms of infection to call the clinic to be seen.    The patient was advised to call immediately if he has any concerning symptoms in the interval. The patient voices understanding of current disease status and treatment options and is in agreement with the current care plan. All questions were answered. The patient knows to call the clinic with any problems,  questions or concerns. We can certainly see the patient much sooner if necessary        Orders Placed This Encounter  Procedures   CBC with Differential (Cancer Center Only)    Standing Status:   Future    Expected Date:   07/07/2023    Expiration Date:   07/06/2024   CMP (Cancer Center only)    Standing Status:   Future    Expected Date:   07/07/2023    Expiration Date:   07/06/2024   T4    Standing Status:   Future    Expected Date:   07/07/2023    Expiration Date:   07/06/2024   TSH    Standing Status:   Future    Expected Date:   07/07/2023    Expiration Date:   07/06/2024     The total time  spent in the appointment was 20-29 minutes  Shine Scrogham L Malea Swilling, PA-C 06/30/23

## 2023-06-25 ENCOUNTER — Encounter: Payer: PRIVATE HEALTH INSURANCE | Admitting: Dietician

## 2023-06-25 ENCOUNTER — Ambulatory Visit

## 2023-06-25 ENCOUNTER — Ambulatory Visit: Payer: PRIVATE HEALTH INSURANCE

## 2023-06-25 ENCOUNTER — Other Ambulatory Visit: Payer: Self-pay

## 2023-06-25 ENCOUNTER — Ambulatory Visit: Payer: Self-pay | Admitting: Physician Assistant

## 2023-06-25 ENCOUNTER — Ambulatory Visit: Admission: RE | Admit: 2023-06-25 | Payer: PRIVATE HEALTH INSURANCE | Source: Ambulatory Visit

## 2023-06-25 ENCOUNTER — Ambulatory Visit: Payer: Self-pay

## 2023-06-25 DIAGNOSIS — C3411 Malignant neoplasm of upper lobe, right bronchus or lung: Secondary | ICD-10-CM

## 2023-06-26 ENCOUNTER — Ambulatory Visit: Payer: PRIVATE HEALTH INSURANCE

## 2023-06-26 ENCOUNTER — Ambulatory Visit: Payer: Self-pay

## 2023-06-26 ENCOUNTER — Inpatient Hospital Stay: Payer: PRIVATE HEALTH INSURANCE | Attending: Internal Medicine | Admitting: Licensed Clinical Social Worker

## 2023-06-26 DIAGNOSIS — Z510 Encounter for antineoplastic radiation therapy: Secondary | ICD-10-CM | POA: Insufficient documentation

## 2023-06-26 DIAGNOSIS — C3411 Malignant neoplasm of upper lobe, right bronchus or lung: Secondary | ICD-10-CM | POA: Insufficient documentation

## 2023-06-26 DIAGNOSIS — Z452 Encounter for adjustment and management of vascular access device: Secondary | ICD-10-CM | POA: Insufficient documentation

## 2023-06-26 DIAGNOSIS — D709 Neutropenia, unspecified: Secondary | ICD-10-CM | POA: Insufficient documentation

## 2023-06-26 NOTE — Progress Notes (Signed)
 [facility] CSW Progress Note  Clinical Child psychotherapist received a referral from radiation team requesting a call to pt regarding missed appts.  CSW reached pt by phone.  Pt has continued to work while undergoing treatment and has needed to change jobs as a result of missing too much work to accommodate his appointments.  Pt reports this is a new job and he does not want to jeopardize his employment again by needing to frequently leave work on short notice because the times of his scheduled appointments are being changed w/o warning or notice.  Pt states he has been clear with the radiation team that his appointments need to be late in the afternoon or he will not be able to make it.  Pt's remaining appointments for radiation are all presently scheduled for late in the afternoon.  The above information communicated to radiation team.  CSW inquired if pt is experiencing any financial stress due to missed work.  Pt responded that at this time he is not, but he does need to remain employed.  CSW provided contact details should any concerns arise regarding finances.  CSW to remain available as appropriate throughout duration of treatment.       Quenton Bruns, LCSW Clinical Social Worker [facility]

## 2023-06-27 ENCOUNTER — Ambulatory Visit: Payer: PRIVATE HEALTH INSURANCE

## 2023-06-27 ENCOUNTER — Other Ambulatory Visit: Payer: Self-pay

## 2023-06-27 ENCOUNTER — Ambulatory Visit: Payer: Self-pay

## 2023-06-27 ENCOUNTER — Ambulatory Visit
Admission: RE | Admit: 2023-06-27 | Discharge: 2023-06-27 | Disposition: A | Discharge status: Home / Self Care | Payer: PRIVATE HEALTH INSURANCE | Source: Ambulatory Visit | Attending: Radiation Oncology | Admitting: Radiation Oncology

## 2023-06-27 DIAGNOSIS — Z452 Encounter for adjustment and management of vascular access device: Secondary | ICD-10-CM | POA: Diagnosis not present

## 2023-06-27 DIAGNOSIS — D709 Neutropenia, unspecified: Secondary | ICD-10-CM | POA: Diagnosis not present

## 2023-06-27 DIAGNOSIS — C3411 Malignant neoplasm of upper lobe, right bronchus or lung: Secondary | ICD-10-CM | POA: Diagnosis present

## 2023-06-27 DIAGNOSIS — Z510 Encounter for antineoplastic radiation therapy: Secondary | ICD-10-CM | POA: Diagnosis present

## 2023-06-27 LAB — RAD ONC ARIA SESSION SUMMARY
Course Elapsed Days: 72
Plan Fractions Treated to Date: 1
Plan Fractions Treated to Date: 12
Plan Prescribed Dose Per Fraction: 10 Gy
Plan Prescribed Dose Per Fraction: 2 Gy
Plan Total Fractions Prescribed: 17
Plan Total Fractions Prescribed: 5
Plan Total Prescribed Dose: 34 Gy
Plan Total Prescribed Dose: 50 Gy
Reference Point Dosage Given to Date: 10 Gy
Reference Point Dosage Given to Date: 60 Gy
Reference Point Session Dosage Given: 10 Gy
Reference Point Session Dosage Given: 2 Gy
Session Number: 30

## 2023-06-27 MED FILL — Fosaprepitant Dimeglumine For IV Infusion 150 MG (Base Eq): INTRAVENOUS | Qty: 5 | Status: AC

## 2023-06-30 ENCOUNTER — Inpatient Hospital Stay (HOSPITAL_BASED_OUTPATIENT_CLINIC_OR_DEPARTMENT_OTHER): Payer: PRIVATE HEALTH INSURANCE | Admitting: Physician Assistant

## 2023-06-30 ENCOUNTER — Ambulatory Visit: Payer: PRIVATE HEALTH INSURANCE

## 2023-06-30 ENCOUNTER — Inpatient Hospital Stay: Payer: PRIVATE HEALTH INSURANCE

## 2023-06-30 ENCOUNTER — Other Ambulatory Visit: Payer: Self-pay

## 2023-06-30 VITALS — BP 123/76 | HR 84 | Temp 98.5°F | Resp 18 | Wt 178.7 lb

## 2023-06-30 DIAGNOSIS — Z510 Encounter for antineoplastic radiation therapy: Secondary | ICD-10-CM | POA: Diagnosis not present

## 2023-06-30 DIAGNOSIS — C3411 Malignant neoplasm of upper lobe, right bronchus or lung: Secondary | ICD-10-CM

## 2023-06-30 DIAGNOSIS — Z95828 Presence of other vascular implants and grafts: Secondary | ICD-10-CM

## 2023-06-30 LAB — CBC WITH DIFFERENTIAL (CANCER CENTER ONLY)
Abs Immature Granulocytes: 0.03 10*3/uL (ref 0.00–0.07)
Basophils Absolute: 0 10*3/uL (ref 0.0–0.1)
Basophils Relative: 1 %
Eosinophils Absolute: 0 10*3/uL (ref 0.0–0.5)
Eosinophils Relative: 2 %
HCT: 32.1 % — ABNORMAL LOW (ref 39.0–52.0)
Hemoglobin: 11.1 g/dL — ABNORMAL LOW (ref 13.0–17.0)
Immature Granulocytes: 1 %
Lymphocytes Relative: 8 %
Lymphs Abs: 0.2 10*3/uL — ABNORMAL LOW (ref 0.7–4.0)
MCH: 33.5 pg (ref 26.0–34.0)
MCHC: 34.6 g/dL (ref 30.0–36.0)
MCV: 97 fL (ref 80.0–100.0)
Monocytes Absolute: 0.6 10*3/uL (ref 0.1–1.0)
Monocytes Relative: 28 %
Neutro Abs: 1.3 10*3/uL — ABNORMAL LOW (ref 1.7–7.7)
Neutrophils Relative %: 60 %
Platelet Count: 205 10*3/uL (ref 150–400)
RBC: 3.31 MIL/uL — ABNORMAL LOW (ref 4.22–5.81)
RDW: 18.5 % — ABNORMAL HIGH (ref 11.5–15.5)
WBC Count: 2.2 10*3/uL — ABNORMAL LOW (ref 4.0–10.5)
nRBC: 0 % (ref 0.0–0.2)

## 2023-06-30 LAB — CMP (CANCER CENTER ONLY)
ALT: 13 U/L (ref 0–44)
AST: 15 U/L (ref 15–41)
Albumin: 4 g/dL (ref 3.5–5.0)
Alkaline Phosphatase: 58 U/L (ref 38–126)
Anion gap: 4 — ABNORMAL LOW (ref 5–15)
BUN: 12 mg/dL (ref 6–20)
CO2: 28 mmol/L (ref 22–32)
Calcium: 8.9 mg/dL (ref 8.9–10.3)
Chloride: 108 mmol/L (ref 98–111)
Creatinine: 0.74 mg/dL (ref 0.61–1.24)
GFR, Estimated: >60 mL/min (ref >60)
Glucose, Bld: 113 mg/dL — ABNORMAL HIGH (ref 70–99)
Potassium: 4.1 mmol/L (ref 3.5–5.1)
Sodium: 140 mmol/L (ref 135–145)
Total Bilirubin: 0.4 mg/dL (ref 0.0–1.2)
Total Protein: 6.2 g/dL — ABNORMAL LOW (ref 6.5–8.1)

## 2023-06-30 LAB — TSH: TSH: 0.846 u[IU]/mL (ref 0.350–4.500)

## 2023-06-30 MED ORDER — SODIUM CHLORIDE 0.9% FLUSH
10.0000 mL | Freq: Once | INTRAVENOUS | Status: AC
  Administered 2023-06-30: 10 mL

## 2023-07-01 ENCOUNTER — Ambulatory Visit
Admission: RE | Admit: 2023-07-01 | Discharge: 2023-07-01 | Disposition: A | Discharge status: Home / Self Care | Payer: PRIVATE HEALTH INSURANCE | Source: Ambulatory Visit | Attending: Radiation Oncology

## 2023-07-01 ENCOUNTER — Inpatient Hospital Stay: Payer: PRIVATE HEALTH INSURANCE

## 2023-07-01 ENCOUNTER — Ambulatory Visit: Payer: PRIVATE HEALTH INSURANCE

## 2023-07-01 ENCOUNTER — Other Ambulatory Visit: Payer: Self-pay

## 2023-07-01 ENCOUNTER — Inpatient Hospital Stay: Payer: PRIVATE HEALTH INSURANCE | Admitting: Dietician

## 2023-07-01 DIAGNOSIS — Z510 Encounter for antineoplastic radiation therapy: Secondary | ICD-10-CM | POA: Diagnosis not present

## 2023-07-01 LAB — RAD ONC ARIA SESSION SUMMARY
Course Elapsed Days: 76
Plan Fractions Treated to Date: 13
Plan Prescribed Dose Per Fraction: 2 Gy
Plan Total Fractions Prescribed: 17
Plan Total Prescribed Dose: 34 Gy
Reference Point Dosage Given to Date: 62 Gy
Reference Point Session Dosage Given: 2 Gy
Session Number: 31

## 2023-07-01 LAB — T4: T4, Total: 5.4 ug/dL (ref 4.5–12.0)

## 2023-07-01 NOTE — Progress Notes (Signed)
 Planning to see patient during infusion for nutrition follow-up. Chart reviewed. Weights stable. Patient started new job. Requested to cancel infusions this week. Will reschedule as able.

## 2023-07-02 ENCOUNTER — Ambulatory Visit: Payer: PRIVATE HEALTH INSURANCE

## 2023-07-02 ENCOUNTER — Ambulatory Visit: Payer: Self-pay

## 2023-07-03 ENCOUNTER — Telehealth: Payer: Self-pay | Admitting: *Deleted

## 2023-07-03 ENCOUNTER — Ambulatory Visit: Admission: RE | Admit: 2023-07-03 | Payer: PRIVATE HEALTH INSURANCE | Source: Ambulatory Visit

## 2023-07-03 NOTE — Telephone Encounter (Signed)
 Copied from CRM [id number]. Topic: Clinical - Prescription Issue >> Jul 01, 2023  3:12 PM Isabell A wrote: Reason for CRM: Patient is requesting to speak to someone in regard to alternatives for the following prescriptions:  albuterol  (VENTOLIN  HFA) 108 (90 Base) MCG/ACT inhaler   Tiotropium Bromide-Olodaterol (STIOLTO RESPIMAT ) 2.5-2.5 MCG/ACT AERS     His copay for these medications is $ 578.  Previous CRM sent, explained notes - patient still requesting to speak with office.

## 2023-07-04 ENCOUNTER — Ambulatory Visit
Admission: RE | Admit: 2023-07-04 | Discharge: 2023-07-04 | Disposition: A | Discharge status: Home / Self Care | Payer: PRIVATE HEALTH INSURANCE | Source: Ambulatory Visit | Attending: Radiation Oncology | Admitting: Radiation Oncology

## 2023-07-04 ENCOUNTER — Other Ambulatory Visit: Payer: Self-pay

## 2023-07-04 DIAGNOSIS — Z510 Encounter for antineoplastic radiation therapy: Secondary | ICD-10-CM | POA: Diagnosis not present

## 2023-07-04 LAB — RAD ONC ARIA SESSION SUMMARY
Course Elapsed Days: 79
Plan Fractions Treated to Date: 14
Plan Fractions Treated to Date: 2
Plan Prescribed Dose Per Fraction: 10 Gy
Plan Prescribed Dose Per Fraction: 2 Gy
Plan Total Fractions Prescribed: 17
Plan Total Fractions Prescribed: 5
Plan Total Prescribed Dose: 34 Gy
Plan Total Prescribed Dose: 50 Gy
Reference Point Dosage Given to Date: 20 Gy
Reference Point Dosage Given to Date: 64 Gy
Reference Point Session Dosage Given: 10 Gy
Reference Point Session Dosage Given: 2 Gy
Session Number: 32

## 2023-07-07 ENCOUNTER — Inpatient Hospital Stay: Payer: PRIVATE HEALTH INSURANCE

## 2023-07-07 ENCOUNTER — Other Ambulatory Visit: Payer: Self-pay

## 2023-07-07 ENCOUNTER — Ambulatory Visit
Admission: RE | Admit: 2023-07-07 | Discharge: 2023-07-07 | Disposition: A | Discharge status: Home / Self Care | Payer: PRIVATE HEALTH INSURANCE | Source: Ambulatory Visit | Attending: Radiation Oncology | Admitting: Radiation Oncology

## 2023-07-07 ENCOUNTER — Ambulatory Visit: Payer: PRIVATE HEALTH INSURANCE

## 2023-07-07 DIAGNOSIS — Z510 Encounter for antineoplastic radiation therapy: Secondary | ICD-10-CM | POA: Diagnosis not present

## 2023-07-07 LAB — RAD ONC ARIA SESSION SUMMARY
Course Elapsed Days: 82
Plan Fractions Treated to Date: 15
Plan Fractions Treated to Date: 3
Plan Prescribed Dose Per Fraction: 10 Gy
Plan Prescribed Dose Per Fraction: 2 Gy
Plan Total Fractions Prescribed: 17
Plan Total Fractions Prescribed: 5
Plan Total Prescribed Dose: 34 Gy
Plan Total Prescribed Dose: 50 Gy
Reference Point Dosage Given to Date: 30 Gy
Reference Point Dosage Given to Date: 66 Gy
Reference Point Session Dosage Given: 10 Gy
Reference Point Session Dosage Given: 2 Gy
Session Number: 33

## 2023-07-08 ENCOUNTER — Inpatient Hospital Stay: Payer: PRIVATE HEALTH INSURANCE

## 2023-07-08 ENCOUNTER — Ambulatory Visit: Payer: PRIVATE HEALTH INSURANCE

## 2023-07-08 ENCOUNTER — Other Ambulatory Visit: Payer: PRIVATE HEALTH INSURANCE

## 2023-07-08 ENCOUNTER — Encounter: Payer: Self-pay | Admitting: Internal Medicine

## 2023-07-09 ENCOUNTER — Ambulatory Visit: Payer: PRIVATE HEALTH INSURANCE

## 2023-07-09 ENCOUNTER — Ambulatory Visit
Admission: RE | Admit: 2023-07-09 | Discharge: 2023-07-09 | Disposition: A | Discharge status: Home / Self Care | Payer: PRIVATE HEALTH INSURANCE | Source: Ambulatory Visit | Attending: Radiation Oncology | Admitting: Radiation Oncology

## 2023-07-09 ENCOUNTER — Other Ambulatory Visit: Payer: Self-pay

## 2023-07-09 ENCOUNTER — Other Ambulatory Visit: Payer: Self-pay | Admitting: Physician Assistant

## 2023-07-09 DIAGNOSIS — Z510 Encounter for antineoplastic radiation therapy: Secondary | ICD-10-CM | POA: Diagnosis not present

## 2023-07-09 DIAGNOSIS — C3411 Malignant neoplasm of upper lobe, right bronchus or lung: Secondary | ICD-10-CM

## 2023-07-09 LAB — RAD ONC ARIA SESSION SUMMARY
Course Elapsed Days: 84
Plan Fractions Treated to Date: 16
Plan Fractions Treated to Date: 4
Plan Prescribed Dose Per Fraction: 10 Gy
Plan Prescribed Dose Per Fraction: 2 Gy
Plan Total Fractions Prescribed: 17
Plan Total Fractions Prescribed: 5
Plan Total Prescribed Dose: 34 Gy
Plan Total Prescribed Dose: 50 Gy
Reference Point Dosage Given to Date: 40 Gy
Reference Point Dosage Given to Date: 68 Gy
Reference Point Session Dosage Given: 10 Gy
Reference Point Session Dosage Given: 2 Gy
Session Number: 34

## 2023-07-09 NOTE — Progress Notes (Signed)
 I reached out to the pt on his mobile number to let him know that he needs to have his chemotherapy rescheduled as soon as possible. Pt did not answer his phone. I left a VM requesting he call me or our schedulers.  I also called the pt's wife, Merritt Ables. Merritt Ables states he is probably at work. I asked her if she would let him know that the clinic is trying to reach him to rescheduled his chemo. Merritt Ables states she would do so.

## 2023-07-09 NOTE — Progress Notes (Signed)
 I saw the pt in person today at his radiation treatment appt. I wanted to talk to the pt and let him know that I spoke to the oncology team about his desire to not reschedule last weeks chemo and to just get his next treatment on 7/7. I wanted to make sure the pt was aware that his cancer is aggressive and delaying treatments can put him at risk for the cancer spreading. Pt verbalized understanding of risks involved in delaying treatment.

## 2023-07-10 ENCOUNTER — Ambulatory Visit: Payer: PRIVATE HEALTH INSURANCE

## 2023-07-10 ENCOUNTER — Ambulatory Visit
Admission: RE | Admit: 2023-07-10 | Discharge: 2023-07-10 | Discharge status: Home / Self Care | Payer: PRIVATE HEALTH INSURANCE | Source: Ambulatory Visit | Attending: Radiation Oncology

## 2023-07-10 ENCOUNTER — Other Ambulatory Visit: Payer: Self-pay

## 2023-07-10 ENCOUNTER — Other Ambulatory Visit: Payer: Self-pay | Admitting: Physician Assistant

## 2023-07-10 ENCOUNTER — Encounter: Payer: PRIVATE HEALTH INSURANCE | Admitting: Dietician

## 2023-07-10 ENCOUNTER — Encounter: Payer: Self-pay | Admitting: Internal Medicine

## 2023-07-10 DIAGNOSIS — C3411 Malignant neoplasm of upper lobe, right bronchus or lung: Secondary | ICD-10-CM

## 2023-07-10 DIAGNOSIS — Z510 Encounter for antineoplastic radiation therapy: Secondary | ICD-10-CM | POA: Diagnosis not present

## 2023-07-10 LAB — RAD ONC ARIA SESSION SUMMARY
Course Elapsed Days: 85
Plan Fractions Treated to Date: 17
Plan Prescribed Dose Per Fraction: 2 Gy
Plan Total Fractions Prescribed: 17
Plan Total Prescribed Dose: 34 Gy
Reference Point Dosage Given to Date: 70 Gy
Reference Point Session Dosage Given: 2 Gy
Session Number: 35

## 2023-07-11 ENCOUNTER — Other Ambulatory Visit: Payer: Self-pay

## 2023-07-11 ENCOUNTER — Ambulatory Visit
Admission: RE | Admit: 2023-07-11 | Discharge: 2023-07-11 | Disposition: A | Discharge status: Home / Self Care | Payer: PRIVATE HEALTH INSURANCE | Source: Ambulatory Visit | Attending: Radiation Oncology | Admitting: Radiation Oncology

## 2023-07-11 ENCOUNTER — Ambulatory Visit: Payer: PRIVATE HEALTH INSURANCE

## 2023-07-11 DIAGNOSIS — Z510 Encounter for antineoplastic radiation therapy: Secondary | ICD-10-CM | POA: Diagnosis not present

## 2023-07-11 DIAGNOSIS — C3411 Malignant neoplasm of upper lobe, right bronchus or lung: Secondary | ICD-10-CM

## 2023-07-11 LAB — RAD ONC ARIA SESSION SUMMARY
Course Elapsed Days: 86
Plan Fractions Treated to Date: 5
Plan Prescribed Dose Per Fraction: 10 Gy
Plan Total Fractions Prescribed: 5
Plan Total Prescribed Dose: 50 Gy
Reference Point Dosage Given to Date: 50 Gy
Reference Point Session Dosage Given: 10 Gy
Session Number: 36

## 2023-07-14 ENCOUNTER — Ambulatory Visit

## 2023-07-14 ENCOUNTER — Other Ambulatory Visit

## 2023-07-14 ENCOUNTER — Ambulatory Visit: Admitting: Internal Medicine

## 2023-07-14 ENCOUNTER — Other Ambulatory Visit: Payer: Self-pay

## 2023-07-14 ENCOUNTER — Ambulatory Visit: Payer: Self-pay | Admitting: Internal Medicine

## 2023-07-14 ENCOUNTER — Ambulatory Visit: Payer: Self-pay

## 2023-07-14 NOTE — Radiation Completion Notes (Addendum)
  Radiation Oncology         [phone] ________________________________  Name: Eric Foley MRN: [id number]  Date: 07/11/2023  DOB: 08-Jul-1970  Referring Physician: SHERROD SHERROD, M.D. Date of Service: 2023-07-14 Radiation Oncologist: Adina Barge, M.D. 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   Intent: Curative     ==========DELIVERED PLANS==========  First Treatment Date: 2023-04-16 Last Treatment Date: 2023-07-11   Plan Name: Lung_R Site: Lung, Right Technique: IMRT Mode: Photon Dose Per Fraction: 2 Gy Prescribed Dose (Delivered / Prescribed): 34 Gy / 34 Gy Prescribed Fxs (Delivered / Prescribed): 17 / 17   Plan Name: Lung_R:2 Site: Lung, Right Technique: IMRT Mode: Photon Dose Per Fraction: 2 Gy Prescribed Dose (Delivered / Prescribed): 34 Gy / 34 Gy Prescribed Fxs (Delivered / Prescribed): 17 / 17   Plan Name: Lung_R:1 Site: Lung, Right Technique: IMRT Mode: Photon Dose Per Fraction: 2 Gy Prescribed Dose (Delivered / Prescribed): 2 Gy / 2 Gy Prescribed Fxs (Delivered / Prescribed): 1 / 1   Plan Name: Pelvis_SBRT Site: Internal Iliac Nodes Technique: SBRT/SRT-IMRT Mode: Photon Dose Per Fraction: 10 Gy Prescribed Dose (Delivered / Prescribed): 50 Gy / 50 Gy Prescribed Fxs (Delivered / Prescribed): 5 / 5     ==========ON TREATMENT VISIT DATES========== 2023-04-17, 2023-04-24, 2023-05-06, 2023-05-09, 2023-05-15, 2023-05-22, 2023-05-30, 2023-06-06, 2023-06-12, 2023-06-19, 2023-06-27, 2023-07-04, 2023-07-04, 2023-07-07, 2023-07-11    See weekly On Treatment Notes in Epic for details in the Media tab (listed as Progress notes on the On Treatment Visit Dates listed above).  The patient tolerated his treatments well with only modest fatigue.  The patient will receive  a call in about one month from the radiation oncology department. He will continue follow up with his medical oncologist, Dr. SHERROD, as well.  ------------------------------------------------   Donnice Barge, MD [facility]  Radiation Oncology Direct Dial: [phone]  Fax: [phone] [email or web address]  Skype  LinkedIn

## 2023-07-15 ENCOUNTER — Inpatient Hospital Stay: Payer: PRIVATE HEALTH INSURANCE

## 2023-07-15 ENCOUNTER — Ambulatory Visit

## 2023-07-16 ENCOUNTER — Ambulatory Visit

## 2023-07-16 ENCOUNTER — Ambulatory Visit: Payer: Self-pay | Admitting: Internal Medicine

## 2023-07-16 ENCOUNTER — Ambulatory Visit: Payer: Self-pay

## 2023-07-16 ENCOUNTER — Other Ambulatory Visit: Payer: Self-pay

## 2023-07-17 ENCOUNTER — Ambulatory Visit: Payer: Self-pay

## 2023-07-18 ENCOUNTER — Ambulatory Visit: Payer: Self-pay

## 2023-07-21 ENCOUNTER — Other Ambulatory Visit: Payer: Self-pay

## 2023-07-21 ENCOUNTER — Ambulatory Visit: Payer: Self-pay | Admitting: Internal Medicine

## 2023-07-21 ENCOUNTER — Ambulatory Visit: Payer: Self-pay

## 2023-07-22 ENCOUNTER — Ambulatory Visit: Payer: Self-pay

## 2023-07-23 ENCOUNTER — Ambulatory Visit: Payer: Self-pay

## 2023-07-24 MED FILL — Fosaprepitant Dimeglumine For IV Infusion 150 MG (Base Eq): INTRAVENOUS | Qty: 5 | Status: AC

## 2023-07-24 NOTE — Progress Notes (Signed)
 [facility] OFFICE PROGRESS NOTE  Patient, No Pcp Per No address on file  DIAGNOSIS: Extensive stage Small cell lung cancer. He presented with a large right upper lobe lung mass with mediastinal invasion, right pleural/subpleural involvement, left supraclavicular adenopathy, and possible pelvic peritoneal nodule. He was diagnosed in March 2025.   PRIOR THERAPY: Radiation to the large right lung mass and pelvis under the care of Dr. Patrcia last dose on 07/11/23  CURRENT THERAPY: 1) palliative systemic chemotherapy with carboplatin  for an AUC of 5 and etoposide  100 mg/m on days 1, 2, and 3 IV every 3 weeks and Imfinzi  once he completes his radiation to the chest. First dose on 05/12/23. He also receives Cosela  for myeloprotection. Status post 2 cycles of treatment.    INTERVAL HISTORY: Eric Foley 53 y.o. male returns to the clinic today for follow-up visit.  The patient was last seen on 06/30/2023. In summary the patient has small cell lung cancer. The patient was supposed to get treatment a few weeks ago but the patient recently got a new job and was concerned about missing work.  The patient understands our recommendation is to receive treatment on the appropriate schedule interval due to the aggressive nature of his cancer.   The patient is feeling fairly well today.  He denies any fever, chills, or night sweats. He lost a few pounds due to being more active at work. He drinks 2 ensures per day and taking multivitamins and B12.  His breathing is great. He denies any shortness of breath, cough, swelling, hemoptysis, chest pain, nausea, vomiting, diarrhea, constipation, headaches, or vision changes. He is here for evaluation and repeat blood work before undergoing cycle #3.   MEDICAL HISTORY: Past Medical History:  Diagnosis Date   COPD (chronic obstructive pulmonary disease) (HCC)    History of kidney stones     ALLERGIES:  is allergic to penicillins, amoxicillin, and  ampicillin-sulbactam sodium.  MEDICATIONS:  Current Outpatient Medications  Medication Sig Dispense Refill   albuterol  (VENTOLIN  HFA) 108 (90 Base) MCG/ACT inhaler Inhale 2 puffs into the lungs every 6 (six) hours as needed for wheezing or shortness of breath. (Patient not taking: Reported on 06/30/2023) 8 g 6   Aspirin-Acetaminophen (GOODYS BODY PAIN PO) Take by mouth 2 (two) times daily.     lidocaine -prilocaine  (EMLA ) cream Apply 1 Application topically as needed. (Patient not taking: Reported on 06/30/2023) 30 g 2   Potassium 99 MG TABS Take 99 mg by mouth 2 (two) times daily.     prochlorperazine  (COMPAZINE ) 10 MG tablet Take 1 tablet (10 mg total) by mouth every 6 (six) hours as needed. 30 tablet 2   Tiotropium Bromide-Olodaterol (STIOLTO RESPIMAT ) 2.5-2.5 MCG/ACT AERS Inhale 2 puffs into the lungs daily. (Patient not taking: Reported on 06/30/2023) 4 g 11   No current facility-administered medications for this visit.    SURGICAL HISTORY:  Past Surgical History:  Procedure Laterality Date   BRONCHIAL BIOPSY  04/22/2023   Procedure: BRONCHOSCOPY, WITH BIOPSY;  Surgeon: Shelah Lamar RAMAN, MD;  Location: [facility] ENDOSCOPY;  Service: Pulmonary;;   BRONCHIAL BRUSHINGS  04/22/2023   Procedure: BRONCHOSCOPY, WITH BRUSH BIOPSY;  Surgeon: Shelah Lamar RAMAN, MD;  Location: [facility] ENDOSCOPY;  Service: Pulmonary;;   FLEXIBLE BRONCHOSCOPY  04/22/2023   Procedure: ELLIOTT SIDE;  Surgeon: Shelah Lamar RAMAN, MD;  Location: [facility] ENDOSCOPY;  Service: Pulmonary;;   HEMOSTASIS CLIP PLACEMENT  04/22/2023   Procedure: CONTROL OF HEMORRHAGE, lung;  Surgeon: Shelah Lamar RAMAN, MD;  Location: [facility] ENDOSCOPY;  Service: Pulmonary;;   IR IMAGING GUIDED PORT INSERTION  05/16/2023   TONSILLECTOMY      REVIEW OF SYSTEMS:   Review of Systems  Constitutional: Negative for appetite change, chills, fatigue, fever and unexpected weight change.  HENT:   Negative for mouth sores, nosebleeds, sore throat and trouble swallowing.   Eyes:  Negative for eye problems and icterus.  Respiratory: Negative for cough, hemoptysis, shortness of breath and wheezing.   Cardiovascular: Negative for chest pain and leg swelling.  Gastrointestinal: Negative for abdominal pain, constipation, diarrhea, nausea and vomiting.  Genitourinary: Negative for bladder incontinence, difficulty urinating, dysuria, frequency and hematuria.   Musculoskeletal: Negative for back pain, gait problem, neck pain and neck stiffness.  Skin: Negative for itching and rash.  Neurological: Negative for dizziness, extremity weakness, gait problem, headaches, light-headedness and seizures.  Hematological: Negative for adenopathy. Does not bruise/bleed easily.  Psychiatric/Behavioral: Negative for confusion, depression and sleep disturbance. The patient is not nervous/anxious.     PHYSICAL EXAMINATION:  There were no vitals taken for this visit.  ECOG PERFORMANCE STATUS: 1  Physical Exam  Constitutional: Oriented to person, place, and time and well-developed, well-nourished, and in no distress.  HENT:  Head: Normocephalic and atraumatic.  Mouth/Throat: Oropharynx is clear and moist. No oropharyngeal exudate.  Eyes: Conjunctivae are normal. Right eye exhibits no discharge. Left eye exhibits no discharge. No scleral icterus.  Neck: Normal range of motion. Neck supple.  Cardiovascular: Normal rate, regular rhythm, normal heart sounds and intact distal pulses.   Pulmonary/Chest: Effort normal. Quiet breath sounds bilaterally. No respiratory distress. No wheezes. No rales.  Abdominal: Soft. Bowel sounds are normal. Exhibits no distension and no mass. There is no tenderness.  Musculoskeletal: Normal range of motion. Exhibits no edema.  Lymphadenopathy:    No cervical adenopathy.  Neurological: Alert and oriented to person, place, and time. Exhibits normal muscle tone. Gait normal. Coordination normal.  Skin: Skin is warm and dry. No rash noted. Not diaphoretic. No  erythema. No pallor.  Psychiatric: Mood, memory and judgment normal.  Vitals reviewed.  LABORATORY DATA: Lab Results  Component Value Date   WBC 2.2 (L) 06/30/2023   HGB 11.1 (L) 06/30/2023   HCT 32.1 (L) 06/30/2023   MCV 97.0 06/30/2023   PLT 205 06/30/2023      Chemistry      Component Value Date/Time   NA 140 06/30/2023 1010   K 4.1 06/30/2023 1010   CL 108 06/30/2023 1010   CO2 28 06/30/2023 1010   BUN 12 06/30/2023 1010   CREATININE 0.74 06/30/2023 1010      Component Value Date/Time   CALCIUM 8.9 06/30/2023 1010   ALKPHOS 58 06/30/2023 1010   AST 15 06/30/2023 1010   ALT 13 06/30/2023 1010   BILITOT 0.4 06/30/2023 1010       RADIOGRAPHIC STUDIES:  No results found.   ASSESSMENT/PLAN:  This is a very pleasant 53 year old Caucasian male diagnosed with at least stage IIIC Small cell lung cancer (T4, N3, M0).  He presented with a large right upper lobe lung mass with mediastinal invasion, right pleural/subpleural involvement, left supraclavicular adenopathy, and possible pelvic peritoneal nodule.  He was diagnosed in March 2025.   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    He started palliative  systemic chemotherapy with carboplatin  for an AUC of 5 day 1 and etoposide  100 mg/m on day  1, 2, and 3 on 05/12/23. He is status post 2 cycles.  Immunotherapy will be added starting from cycle #3 since he has completed his radiation.  Labs were reviewed.  Recommend that he proceed with cycle #3 today scheduled.  I will arrange for restaging CT scan after cycle #4.  We will continue to monitor his labs closely on a weekly basis.  Radiation therapy for cancer SBRT ongoing to lung and pelvis through June 16th.    He understands should he develop any signs or symptoms of infection to call the clinic to be seen.   Had a lengthy discussion with the patient today about  receiving his treatments on schedule.  It would not be recommended to wait several weeks before undergoing his next cycle of treatment as that could cause lead to earlier disease progression.   Continue nutritional support with Ensure and multivitamins.  Schedule follow-up scans after the fourth cycle of treatment.  - Monitor labs weekly, preferably on Monday afternoons, for timely intervention if needed. - Educate on potential side effects of immunotherapy.   The patient was advised to call immediately if he has any concerning symptoms in the interval. The patient voices understanding of current disease status and treatment options and is in agreement with the current care plan. All questions were answered. The patient knows to call the clinic with any problems, questions or concerns. We can certainly see the patient much sooner if necessary       No orders of the defined types were placed in this encounter.    The total time spent in the appointment was 20-29 minutes  Teanna Elem L Dade Rodin, PA-C 07/24/23

## 2023-07-28 ENCOUNTER — Inpatient Hospital Stay (HOSPITAL_BASED_OUTPATIENT_CLINIC_OR_DEPARTMENT_OTHER): Payer: PRIVATE HEALTH INSURANCE | Attending: Internal Medicine | Admitting: Physician Assistant

## 2023-07-28 ENCOUNTER — Inpatient Hospital Stay: Payer: PRIVATE HEALTH INSURANCE | Attending: Internal Medicine

## 2023-07-28 ENCOUNTER — Inpatient Hospital Stay: Payer: PRIVATE HEALTH INSURANCE

## 2023-07-28 ENCOUNTER — Encounter: Payer: Self-pay | Admitting: Internal Medicine

## 2023-07-28 ENCOUNTER — Inpatient Hospital Stay (HOSPITAL_BASED_OUTPATIENT_CLINIC_OR_DEPARTMENT_OTHER): Payer: PRIVATE HEALTH INSURANCE | Admitting: Physician Assistant

## 2023-07-28 VITALS — BP 107/70 | HR 73 | Temp 97.6°F | Resp 18

## 2023-07-28 VITALS — BP 120/70 | HR 90 | Temp 98.0°F | Resp 20

## 2023-07-28 VITALS — BP 113/77 | HR 102 | Temp 98.0°F | Resp 16 | Wt 175.8 lb

## 2023-07-28 DIAGNOSIS — Z5111 Encounter for antineoplastic chemotherapy: Secondary | ICD-10-CM | POA: Diagnosis not present

## 2023-07-28 DIAGNOSIS — Z452 Encounter for adjustment and management of vascular access device: Secondary | ICD-10-CM | POA: Insufficient documentation

## 2023-07-28 DIAGNOSIS — E279 Disorder of adrenal gland, unspecified: Secondary | ICD-10-CM | POA: Insufficient documentation

## 2023-07-28 DIAGNOSIS — C3411 Malignant neoplasm of upper lobe, right bronchus or lung: Secondary | ICD-10-CM | POA: Diagnosis not present

## 2023-07-28 DIAGNOSIS — Z5112 Encounter for antineoplastic immunotherapy: Secondary | ICD-10-CM | POA: Insufficient documentation

## 2023-07-28 DIAGNOSIS — Z79620 Long term (current) use of immunosuppressive biologic: Secondary | ICD-10-CM | POA: Diagnosis not present

## 2023-07-28 DIAGNOSIS — N2889 Other specified disorders of kidney and ureter: Secondary | ICD-10-CM | POA: Diagnosis not present

## 2023-07-28 DIAGNOSIS — M79604 Pain in right leg: Secondary | ICD-10-CM | POA: Diagnosis not present

## 2023-07-28 DIAGNOSIS — Z95828 Presence of other vascular implants and grafts: Secondary | ICD-10-CM

## 2023-07-28 LAB — CBC WITH DIFFERENTIAL (CANCER CENTER ONLY)
Abs Immature Granulocytes: 0.03 K/uL (ref 0.00–0.07)
Basophils Absolute: 0 K/uL (ref 0.0–0.1)
Basophils Relative: 1 %
Eosinophils Absolute: 0.1 K/uL (ref 0.0–0.5)
Eosinophils Relative: 3 %
HCT: 37.8 % — ABNORMAL LOW (ref 39.0–52.0)
Hemoglobin: 13 g/dL (ref 13.0–17.0)
Immature Granulocytes: 1 %
Lymphocytes Relative: 6 %
Lymphs Abs: 0.3 K/uL — ABNORMAL LOW (ref 0.7–4.0)
MCH: 33.2 pg (ref 26.0–34.0)
MCHC: 34.4 g/dL (ref 30.0–36.0)
MCV: 96.7 fL (ref 80.0–100.0)
Monocytes Absolute: 0.7 K/uL (ref 0.1–1.0)
Monocytes Relative: 15 %
Neutro Abs: 3.4 K/uL (ref 1.7–7.7)
Neutrophils Relative %: 74 %
Platelet Count: 191 K/uL (ref 150–400)
RBC: 3.91 MIL/uL — ABNORMAL LOW (ref 4.22–5.81)
RDW: 14.2 % (ref 11.5–15.5)
WBC Count: 4.5 K/uL (ref 4.0–10.5)
nRBC: 0 % (ref 0.0–0.2)

## 2023-07-28 LAB — CMP (CANCER CENTER ONLY)
ALT: 11 U/L (ref 0–44)
AST: 16 U/L (ref 15–41)
Albumin: 4 g/dL (ref 3.5–5.0)
Alkaline Phosphatase: 61 U/L (ref 38–126)
Anion gap: 7 (ref 5–15)
BUN: 11 mg/dL (ref 6–20)
CO2: 28 mmol/L (ref 22–32)
Calcium: 9.4 mg/dL (ref 8.9–10.3)
Chloride: 104 mmol/L (ref 98–111)
Creatinine: 0.85 mg/dL (ref 0.61–1.24)
GFR, Estimated: >60 mL/min (ref >60)
Glucose, Bld: 176 mg/dL — ABNORMAL HIGH (ref 70–99)
Potassium: 4.2 mmol/L (ref 3.5–5.1)
Sodium: 139 mmol/L (ref 135–145)
Total Bilirubin: 0.5 mg/dL (ref 0.0–1.2)
Total Protein: 6.8 g/dL (ref 6.5–8.1)

## 2023-07-28 LAB — TSH: TSH: 2.17 u[IU]/mL (ref 0.350–4.500)

## 2023-07-28 MED ORDER — SODIUM CHLORIDE 0.9 % IV SOLN
150.0000 mg | Freq: Once | INTRAVENOUS | Status: AC
  Administered 2023-07-28: 150 mg via INTRAVENOUS
  Filled 2023-07-28: qty 150
  Filled 2023-07-28 (×2): qty 5

## 2023-07-28 MED ORDER — DEXAMETHASONE SODIUM PHOSPHATE 10 MG/ML IJ SOLN
10.0000 mg | Freq: Once | INTRAMUSCULAR | Status: AC
  Administered 2023-07-28: 10 mg via INTRAVENOUS
  Filled 2023-07-28: qty 1

## 2023-07-28 MED ORDER — SODIUM CHLORIDE 0.9 % IV SOLN
1500.0000 mg | Freq: Once | INTRAVENOUS | Status: AC
  Administered 2023-07-28: 1500 mg via INTRAVENOUS
  Filled 2023-07-28: qty 30

## 2023-07-28 MED ORDER — SODIUM CHLORIDE 0.9 % IV SOLN
665.0000 mg | Freq: Once | INTRAVENOUS | Status: AC
  Administered 2023-07-28: 670 mg via INTRAVENOUS
  Filled 2023-07-28: qty 67

## 2023-07-28 MED ORDER — TRILACICLIB DIHYDROCHLORIDE INJECTION 300 MG
240.0000 mg/m2 | Freq: Once | INTRAVENOUS | Status: AC
  Administered 2023-07-28: 480 mg via INTRAVENOUS
  Filled 2023-07-28: qty 32

## 2023-07-28 MED ORDER — SODIUM CHLORIDE 0.9 % IV SOLN
100.0000 mg/m2 | Freq: Once | INTRAVENOUS | Status: AC
  Administered 2023-07-28: 200 mg via INTRAVENOUS
  Filled 2023-07-28: qty 10

## 2023-07-28 MED ORDER — SODIUM CHLORIDE 0.9 % IV SOLN: INTRAVENOUS | Status: DC

## 2023-07-28 MED ORDER — SODIUM CHLORIDE 0.9 % IV SOLN: Freq: Once | INTRAVENOUS | Status: DC | PRN

## 2023-07-28 MED ORDER — SODIUM CHLORIDE 0.9% FLUSH
10.0000 mL | Freq: Once | INTRAVENOUS | Status: AC
  Administered 2023-07-28: 10 mL

## 2023-07-28 MED ORDER — HEPARIN SOD (PORK) LOCK FLUSH 100 UNIT/ML IV SOLN
500.0000 [IU] | Freq: Once | INTRAVENOUS | Status: AC | PRN
  Administered 2023-07-28: 500 [IU]

## 2023-07-28 MED ORDER — METHYLPREDNISOLONE SODIUM SUCC 125 MG IJ SOLR
125.0000 mg | Freq: Once | INTRAMUSCULAR | Status: AC | PRN
  Administered 2023-07-28: 62.5 mg via INTRAVENOUS

## 2023-07-28 MED ORDER — SODIUM CHLORIDE 0.9% FLUSH
10.0000 mL | INTRAVENOUS | Status: DC | PRN
  Administered 2023-07-28: 10 mL

## 2023-07-28 MED ORDER — PALONOSETRON HCL INJECTION 0.25 MG/5ML
0.2500 mg | Freq: Once | INTRAVENOUS | Status: AC
  Administered 2023-07-28: 0.25 mg via INTRAVENOUS
  Filled 2023-07-28: qty 5

## 2023-07-28 MED ORDER — DIPHENHYDRAMINE HCL 50 MG/ML IJ SOLN
50.0000 mg | Freq: Once | INTRAMUSCULAR | Status: AC | PRN
  Administered 2023-07-28: 25 mg via INTRAVENOUS

## 2023-07-28 MED ORDER — FAMOTIDINE IN NACL 20-0.9 MG/50ML-% IV SOLN
20.0000 mg | Freq: Once | INTRAVENOUS | Status: AC | PRN
  Administered 2023-07-28: 20 mg via INTRAVENOUS

## 2023-07-28 NOTE — Progress Notes (Signed)
 Hypersensitivity Reaction note  Date of event: 07/28/23 Time of event: 1119 Generic name of drug involved: Durvalumab  (Imfinzi ) Name of provider notified of the hypersensitivity reaction:  Mallie Combes, PA Was agent that likely caused hypersensitivity reaction added to Allergies List within EMR? Yes Chain of events including reaction signs/symptoms, treatment administered, and outcome (e.g., drug resumed; drug discontinued; sent to Emergency Department; etc.) Approximately 40 minutes into first time treatment with Durvalumab  (Imfinzi ) pt complained of feeling hot but no diaphoresis noted and lower back pain, rates pain 3-4/10. Medication stopped and Emergency Protocol initiated. Normal saline bolus started and Pepcid  20 mg IV given. Mallie Combes, PA notified and at chairside for assessment/observation. Vital signs remain stable and pt. denies complaints of chest pain, dizziness, and no shortness of breath noted. Pt. medicated with Benadryl  25 mg at 1131 IV and Solumedrol 62.5 mg IV at 1143. Pt. states he no longer feels hot, but continues with lower back pain, rates pain 4/10. Per Mallie, [place] ok to restart Durvalumab  at 1211. Pt. able to complete Durvalumab  without further incidents. Denies pain, rates pain 0/10 and no feeling of flush/hot noted.  Pt. Completed all treatment today with any further issues or concerns, vital signs stable. Left via ambulation and no respiratory distress noted.   Gladis Hough [place], [place] 07/28/2023 12:02 PM

## 2023-07-28 NOTE — Progress Notes (Signed)
    DATE:  07/28/23                                        X CHEMO/IMMUNOTHERAPY REACTION           MD: Sherrod   AGENT/BLOOD PRODUCT RECEIVING TODAY:              carboplatin , durvalumab , etoposide , cosela    AGENT/BLOOD PRODUCT RECEIVING IMMEDIATELY PRIOR TO REACTION:          Durvalvumab    Vitals:   07/28/23 1125 07/28/23 1131  BP: 105/69 107/70  Pulse: 74 73  Resp: 18 18  Temp: 97.6 F (36.4 C)   TempSrc: Oral   SpO2: 99% 99%      REACTION(S):           back pain, diaphoresis   PREMEDS:     Decadron  10 mg IV, Emend 150 mg IV, Aloxi  0.25 mg IV,   INTERVENTION: Benadryl  25 mg IV   Review of Systems  Review of Systems  Constitutional:  Positive for diaphoresis.  Musculoskeletal:  Positive for back pain.  All other systems reviewed and are negative.    Physical Exam  Physical Exam Vitals and nursing note reviewed.  Constitutional:      Appearance: He is not ill-appearing or toxic-appearing.  HENT:     Head: Normocephalic.  Eyes:     Conjunctiva/sclera: Conjunctivae normal.  Cardiovascular:     Rate and Rhythm: Normal rate and regular rhythm.     Pulses: Normal pulses.     Heart sounds: Normal heart sounds.  Pulmonary:     Effort: Pulmonary effort is normal.     Breath sounds: Normal breath sounds.  Abdominal:     General: There is no distension.  Musculoskeletal:     Cervical back: Normal range of motion.  Skin:    General: Skin is warm and dry.  Neurological:     Mental Status: He is alert.     OUTCOME:                  Patient became symptomatic approximately halfway minutes into first time durvalumab  infusion. Emergency medications were administered as documented above. Patient returned to baseline. Oncologist notified and agrees to resume treatment. Patient tolerated remainder of treatment. Pharmacy asked to add pre medications for future treatments.

## 2023-07-28 NOTE — Patient Instructions (Signed)
 [facility] - A DEPT OF [facility]  Discharge Instructions: Thank you for choosing [facility] to provide your oncology and hematology care.   If you have a lab appointment with the Cancer Center, please go directly to the Cancer Center and check in at the registration area.   Wear comfortable clothing and clothing appropriate for easy access to any Portacath or PICC line.   We strive to give you quality time with your provider. You may need to reschedule your appointment if you arrive late (15 or more minutes).  Arriving late affects you and other patients whose appointments are after yours.  Also, if you miss three or more appointments without notifying the office, you may be dismissed from the clinic at the provider's discretion.      For prescription refill requests, have your pharmacy contact our office and allow 72 hours for refills to be completed.    Today you received the following chemotherapy and/or immunotherapy agents: Durvalumab  (Imfinzi ), Carboplatin , and Etoposide .   To help prevent nausea and vomiting after your treatment, we encourage you to take your nausea medication as directed.  BELOW ARE SYMPTOMS THAT SHOULD BE REPORTED IMMEDIATELY: *FEVER GREATER THAN 100.4 F (38 C) OR HIGHER *CHILLS OR SWEATING *NAUSEA AND VOMITING THAT IS NOT CONTROLLED WITH YOUR NAUSEA MEDICATION *UNUSUAL SHORTNESS OF BREATH *UNUSUAL BRUISING OR BLEEDING *URINARY PROBLEMS (pain or burning when urinating, or frequent urination) *BOWEL PROBLEMS (unusual diarrhea, constipation, pain near the anus) TENDERNESS IN MOUTH AND THROAT WITH OR WITHOUT PRESENCE OF ULCERS (sore throat, sores in mouth, or a toothache) UNUSUAL RASH, SWELLING OR PAIN  UNUSUAL VAGINAL DISCHARGE OR ITCHING   Items with * indicate a potential emergency and should be followed up as soon as possible or go to the Emergency Department if any problems should occur.  Please show the  CHEMOTHERAPY ALERT CARD or IMMUNOTHERAPY ALERT CARD at check-in to the Emergency Department and triage nurse.  Should you have questions after your visit or need to cancel or reschedule your appointment, please contact [facility] - A DEPT OF [facility]  Dept: [phone]  and follow the prompts.  Office hours are 8:00 a.m. to 4:30 p.m. Monday - Friday. Please note that voicemails left after 4:00 p.m. may not be returned until the following business day.  We are closed weekends and major holidays. You have access to a nurse at all times for urgent questions. Please call the main number to the clinic Dept: [phone] and follow the prompts.   For any non-urgent questions, you may also contact your provider using MyChart. We now offer e-Visits for anyone 55 and older to request care online for non-urgent symptoms. For details visit [email or web address].   Also download the MyChart app! Go to the app store, search MyChart, open the app, select [facility], and log in with your MyChart username and password.  Durvalumab  Injection What is this medication? DURVALUMAB  (dur VAL ue mab) treats some types of cancer. It works by helping your immune system slow or stop the spread of cancer cells. It is a monoclonal antibody. This medicine may be used for other purposes; ask your health care provider or pharmacist if you have questions. COMMON BRAND NAME(S): IMFINZI  What should I tell my care team before I take this medication? They need to know if you have any of these conditions: Allogeneic stem cell transplant (uses someone else's stem cells)  Autoimmune diseases, such as Crohn disease, ulcerative colitis, lupus History of chest radiation Nervous system problems, such as Guillain-Barre syndrome, myasthenia gravis Organ transplant An unusual or allergic reaction to durvalumab , other medications, foods, dyes, or preservatives Pregnant or trying to get  pregnant Breast-feeding How should I use this medication? This medication is infused into a vein. It is given by your care team in a hospital or clinic setting. A special MedGuide will be given to you before each treatment. Be sure to read this information carefully each time. Talk to your care team about the use of this medication in children. Special care may be needed. Overdosage: If you think you have taken too much of this medicine contact a poison control center or emergency room at once. NOTE: This medicine is only for you. Do not share this medicine with others. What if I miss a dose? Keep appointments for follow-up doses. It is important not to miss your dose. Call your care team if you are unable to keep an appointment. What may interact with this medication? Interactions have not been studied. This list may not describe all possible interactions. Give your health care provider a list of all the medicines, herbs, non-prescription drugs, or dietary supplements you use. Also tell them if you smoke, drink alcohol, or use illegal drugs. Some items may interact with your medicine. What should I watch for while using this medication? Your condition will be monitored carefully while you are receiving this medication. You may need blood work while taking this medication. This medication may cause serious skin reactions. They can happen weeks to months after starting the medication. Contact your care team right away if you notice fevers or flu-like symptoms with a rash. The rash may be red or purple and then turn into blisters or peeling of the skin. You may also notice a red rash with swelling of the face, lips, or lymph nodes in your neck or under your arms. Tell your care team right away if you have any change in your eyesight. Talk to your care team if you may be pregnant. Serious birth defects can occur if you take this medication during pregnancy and for 3 months after the last dose. You will  need a negative pregnancy test before starting this medication. Contraception is recommended while taking this medication and for 3 months after the last dose. Your care team can help you find the option that works for you. Do not breastfeed while taking this medication and for 3 months after the last dose. What side effects may I notice from receiving this medication? Side effects that you should report to your care team as soon as possible: Allergic reactions--skin rash, itching, hives, swelling of the face, lips, tongue, or throat Dry cough, shortness of breath or trouble breathing Eye pain, redness, irritation, or discharge with blurry or decreased vision Heart muscle inflammation--unusual weakness or fatigue, shortness of breath, chest pain, fast or irregular heartbeat, dizziness, swelling of the ankles, feet, or hands Hormone gland problems--headache, sensitivity to light, unusual weakness or fatigue, dizziness, fast or irregular heartbeat, increased sensitivity to cold or heat, excessive sweating, constipation, hair loss, increased thirst or amount of urine, tremors or shaking, irritability Infusion reactions--chest pain, shortness of breath or trouble breathing, feeling faint or lightheaded Kidney injury (glomerulonephritis)--decrease in the amount of urine, red or dark brown urine, foamy or bubbly urine, swelling of the ankles, hands, or feet Liver injury--right upper belly pain, loss of appetite, nausea, light-colored stool, dark yellow  or brown urine, yellowing skin or eyes, unusual weakness or fatigue Pain, tingling, or numbness in the hands or feet, muscle weakness, change in vision, confusion or trouble speaking, loss of balance or coordination, trouble walking, seizures Rash, fever, and swollen lymph nodes Redness, blistering, peeling, or loosening of the skin, including inside the mouth Sudden or severe stomach pain, bloody diarrhea, fever, nausea, vomiting Side effects that usually  do not require medical attention (report these to your care team if they continue or are bothersome): Bone, joint, or muscle pain Diarrhea Fatigue Loss of appetite Nausea Skin rash This list may not describe all possible side effects. Call your doctor for medical advice about side effects. You may report side effects to FDA at 1-800-FDA-1088. Where should I keep my medication? This medication is given in a hospital or clinic. It will not be stored at home. NOTE: This sheet is a summary. It may not cover all possible information. If you have questions about this medicine, talk to your doctor, pharmacist, or health care provider.  2024 Elsevier/Gold Standard (2021-05-22 00:00:00)

## 2023-07-29 ENCOUNTER — Inpatient Hospital Stay: Payer: PRIVATE HEALTH INSURANCE

## 2023-07-29 ENCOUNTER — Encounter (HOSPITAL_COMMUNITY): Payer: Self-pay

## 2023-07-29 ENCOUNTER — Ambulatory Visit (HOSPITAL_BASED_OUTPATIENT_CLINIC_OR_DEPARTMENT_OTHER): Payer: PRIVATE HEALTH INSURANCE | Admitting: Physician Assistant

## 2023-07-29 ENCOUNTER — Encounter: Payer: PRIVATE HEALTH INSURANCE | Admitting: Dietician

## 2023-07-29 VITALS — BP 125/76 | HR 91 | Resp 16

## 2023-07-29 DIAGNOSIS — C3411 Malignant neoplasm of upper lobe, right bronchus or lung: Secondary | ICD-10-CM

## 2023-07-29 DIAGNOSIS — M79604 Pain in right leg: Secondary | ICD-10-CM

## 2023-07-29 DIAGNOSIS — Z5112 Encounter for antineoplastic immunotherapy: Secondary | ICD-10-CM | POA: Diagnosis not present

## 2023-07-29 LAB — T4: T4, Total: 6.8 ug/dL (ref 4.5–12.0)

## 2023-07-29 MED ORDER — SODIUM CHLORIDE 0.9 % IV SOLN
100.0000 mg/m2 | Freq: Once | INTRAVENOUS | Status: AC
  Administered 2023-07-29: 200 mg via INTRAVENOUS
  Filled 2023-07-29: qty 10

## 2023-07-29 MED ORDER — SODIUM CHLORIDE 0.9 % IV SOLN
INTRAVENOUS | Status: DC
Start: 2023-07-29 — End: 2023-07-29

## 2023-07-29 MED ORDER — TRILACICLIB DIHYDROCHLORIDE INJECTION 300 MG
240.0000 mg/m2 | Freq: Once | INTRAVENOUS | Status: AC
  Administered 2023-07-29: 480 mg via INTRAVENOUS
  Filled 2023-07-29: qty 32

## 2023-07-29 MED ORDER — DEXAMETHASONE SODIUM PHOSPHATE 10 MG/ML IJ SOLN
10.0000 mg | Freq: Once | INTRAMUSCULAR | Status: AC
  Administered 2023-07-29: 10 mg via INTRAVENOUS
  Filled 2023-07-29: qty 1

## 2023-07-29 NOTE — Patient Instructions (Signed)
 [facility] DE[facility]  Discharge Instructions: Thank you for choosing [facility] to provide your oncology and hematology care.   If you have a lab appointment with the Cancer Center, please go directly to the Cancer Center and check in at the registration area.   Wear comfortable clothing and clothing appropriate for easy access to any Portacath or PICC line.   We strive to give you quality time with your provider. You may need to reschedule your appointment if you arrive late (15 or more minutes).  Arriving late affects you and other patients whose appointments are after yours.  Also, if you miss three or more appointments without notifying the office, you may be dismissed from the clinic at the provider's discretion.      For prescription refill requests, have your pharmacy contact our office and allow 72 hours for refills to be completed.    Today you received the following chemotherapy and/or immunotherapy agents: Cosela, Etoposide.       To help prevent nausea and vomiting after your treatment, we encourage you to take your nausea medication as directed.  BELOW ARE SYMPTOMS THAT SHOULD BE REPORTED IMMEDIATELY: *FEVER GREATER THAN 100.4 F (38 C) OR HIGHER *CHILLS OR SWEATING *NAUSEA AND VOMITING THAT IS NOT CONTROLLED WITH YOUR NAUSEA MEDICATION *UNUSUAL SHORTNESS OF BREATH *UNUSUAL BRUISING OR BLEEDING *URINARY PROBLEMS (pain or burning when urinating, or frequent urination) *BOWEL PROBLEMS (unusual diarrhea, constipation, pain near the anus) TENDERNESS IN MOUTH AND THROAT WITH OR WITHOUT PRESENCE OF ULCERS (sore throat, sores in mouth, or a toothache) UNUSUAL RASH, SWELLING OR PAIN  UNUSUAL VAGINAL DISCHARGE OR ITCHING   Items with * indicate a potential emergency and should be followed up as soon as possible or go to the Emergency Department if any problems should occur.  Please show the CHEMOTHERAPY ALERT CARD or  IMMUNOTHERAPY ALERT CARD at check-in to the Emergency Department and triage nurse.  Should you have questions after your visit or need to cancel or reschedule your appointment, please contact [facility] - A DEPT OF [facility]  Dept: [phone]  and follow the prompts.  Office hours are 8:00 a.m. to 4:30 p.m. Monday - Friday. Please note that voicemails left after 4:00 p.m. may not be returned until the following business day.  We are closed weekends and major holidays. You have access to a nurse at all times for urgent questions. Please call the main number to the clinic Dept: [phone] and follow the prompts.   For any non-urgent questions, you may also contact your provider using MyChart. We now offer e-Visits for anyone 51 and older to request care online for non-urgent symptoms. For details visit [email or web address].   Also download the MyChart app! Go to the app store, search "MyChart", open the app, select [facility], and log in with your MyChart username and password.

## 2023-07-29 NOTE — Progress Notes (Signed)
 Symptom Management Consult Note [facility]    Patient Care Team: Patient, No Pcp Per as PCP - General (General Practice) Prentis Duwaine BROCKS, RN as Oncology Nurse Navigator    Name / MRN / DOB: Eric Foley  [id number]  [birth year]   Date of visit: 07/29/2023   Chief Complaint/Reason for visit: right knee pain   Current Therapy: palliative systemic chemotherapy with carboplatin  for an AUC of 5 and etoposide  100 mg/m on days 1, 2, and 3 IV every 3 weeks and Imfinzi    Last treatment:  Day 1   Cycle 3 on 07/28/23    ASSESSMENT AND PLAN Patient is a 53 y.o. male with oncologic history of extensive small cell lung cancer followed by Dr. Sherrod.  I have viewed most recent oncology note and lab work.  #Extensive small cell lung cancer - Here for scheduled treatment today. - Next appointment with oncologist is 08/18/23.   #Right knee pain - Concern for DVT given patient's lung cancer and occupation as a truck driver. No obvious deformity on exam, mild swelling to posterior leg behind knee. -STAT DVT study ordered and scheduled for tomorrow. Discussed pain management and strongly advised against further use of Goody's Powder. - Patient also scheduled his restaging CT CAP which can also further evaluate his pain as it is radiating to his low back.    Strict ED precautions discussed should symptoms worsen.   HEME/ONC HISTORY Oncology History  Small cell lung cancer (HCC)  05/01/2023 Initial Diagnosis   Small cell lung cancer (HCC)   05/01/2023 Cancer Staging   Staging form: Lung, AJCC V9 - Clinical: Stage IVA (cT4, cN3, cM1b) - Signed by Sherrod Sherrod, MD on 05/01/2023   05/12/2023 -  Chemotherapy   Patient is on Treatment Plan : LUNG SMALL CELL EXTENSIVE STAGE Durvalumab  + Carboplatin  D1 + Etoposide  D1-3 q21d x 4 Cycles / Durvalumab  q28d         INTERVAL HISTORY  Discussed the use of AI scribe software for clinical note transcription with the patient,  who gave verbal consent to proceed.    Eric Foley is a 53 y.o. male with oncologic history as above presenting to [facility] today with chief complaint of right knee pain. Patient presents unaccompanied to visit today.  Patient reports pain in right knee that started last night. Pain is sharp and constant. It radiates up to his low back and down to his ankle. He denies history of similar pain or any recent injury. He rated pain 8/10 in severity last night. He took a Goody's Powder at 11:30 pm that did not relieve his pain causing him to take a second dose at 2 am this morning. He did experience pain relief and was able to get some sleep. Patient works as a Naval architect. He denies history of blood clots. Denies fever, chills, chest pain, or shortness of breath. He is able to bear weight on right leg however that increases his pain. He is using a cane for comfort.    ROS  All other systems are reviewed and are negative for acute change except as noted in the HPI.    Allergies  Allergen Reactions   Penicillins Anaphylaxis, Rash and Swelling   Amoxicillin Rash   Ampicillin-Sulbactam Sodium Rash   Durvalumab  Other (See Comments)    Pt. complained of lower back pain, rates pain 3-4 out of 10 and feeling hot but not diaphoretic. Medicated with diphenhydramine  and solumedrol. Able to complete  infusion. See progress notes 07/28/23 for further information.     Past Medical History:  Diagnosis Date   COPD (chronic obstructive pulmonary disease) (HCC)    History of kidney stones      Past Surgical History:  Procedure Laterality Date   BRONCHIAL BIOPSY  04/22/2023   Procedure: BRONCHOSCOPY, WITH BIOPSY;  Surgeon: Shelah Lamar RAMAN, MD;  Location: [facility] ENDOSCOPY;  Service: Pulmonary;;   BRONCHIAL BRUSHINGS  04/22/2023   Procedure: BRONCHOSCOPY, WITH BRUSH BIOPSY;  Surgeon: Shelah Lamar RAMAN, MD;  Location: [facility] ENDOSCOPY;  Service: Pulmonary;;   FLEXIBLE BRONCHOSCOPY  04/22/2023   Procedure: ELLIOTT SIDE;  Surgeon: Shelah Lamar RAMAN, MD;  Location: [facility] ENDOSCOPY;  Service: Pulmonary;;   HEMOSTASIS CLIP PLACEMENT  04/22/2023   Procedure: CONTROL OF HEMORRHAGE, lung;  Surgeon: Shelah Lamar RAMAN, MD;  Location: [facility] ENDOSCOPY;  Service: Pulmonary;;   IR IMAGING GUIDED PORT INSERTION  05/16/2023   TONSILLECTOMY      Social History   Socioeconomic History   Marital status: Single    Spouse name: Not on file   Number of children: Not on file   Years of education: Not on file   Highest education level: Not on file  Occupational History   Not on file  Tobacco Use   Smoking status: Every Day    Types: Cigarettes   Smokeless tobacco: Never   Tobacco comments:    1/2 pk daily 06/12/2023  Vaping Use   Vaping status: Never Used  Substance and Sexual Activity   Alcohol use: No   Drug use: No   Sexual activity: Not on file  Other Topics Concern   Not on file  Social History Narrative   Not on file   Social Drivers of Health   Financial Resource Strain: Not on file  Food Insecurity: Not on file  Transportation Needs: Not on file  Physical Activity: Not on file  Stress: Not on file  Social Connections: Unknown (06/04/2021)   Received from [facility]   Social Network    Social Network: Not on file  Intimate Partner Violence: Unknown (04/26/2021)   Received from [facility]   HITS    Physically Hurt: Not on file    Insult or Talk Down To: Not on file    Threaten Physical Harm: Not on file    Scream or Curse: Not on file    No family history on file.   Current Outpatient Medications:    albuterol  (VENTOLIN  HFA) 108 (90 Base) MCG/ACT inhaler, Inhale 2 puffs into the lungs every 6 (six) hours as needed for wheezing or shortness of breath. (Patient not taking: Reported on 06/30/2023), Disp: 8 g, Rfl: 6   Aspirin-Acetaminophen (GOODYS BODY PAIN PO), Take by mouth 2 (two) times daily., Disp: , Rfl:    lidocaine -prilocaine  (EMLA ) cream, Apply 1 Application topically as needed., Disp:  30 g, Rfl: 2   Potassium 99 MG TABS, Take 99 mg by mouth 2 (two) times daily., Disp: , Rfl:    prochlorperazine  (COMPAZINE ) 10 MG tablet, Take 1 tablet (10 mg total) by mouth every 6 (six) hours as needed., Disp: 30 tablet, Rfl: 2   Tiotropium Bromide-Olodaterol (STIOLTO RESPIMAT ) 2.5-2.5 MCG/ACT AERS, Inhale 2 puffs into the lungs daily. (Patient not taking: Reported on 06/30/2023), Disp: 4 g, Rfl: 11 No current facility-administered medications for this visit.  Facility-Administered Medications Ordered in Other Visits:    0.9 %  sodium chloride  infusion, , Intravenous, Continuous, Sherrod Sherrod, MD, Stopped at 07/29/23 1121  PHYSICAL EXAM ECOG FS:1 - Symptomatic but completely ambulatory    Vitals:   07/29/23 0840  BP: 125/76  Pulse: 91  Resp: 16  SpO2: 99%   Physical Exam Vitals and nursing note reviewed.  Constitutional:      Appearance: He is not ill-appearing or toxic-appearing.  HENT:     Head: Normocephalic.  Eyes:     Conjunctiva/sclera: Conjunctivae normal.  Cardiovascular:     Rate and Rhythm: Normal rate.     Pulses:          Dorsalis pedis pulses are 2+ on the right side.  Pulmonary:     Effort: Pulmonary effort is normal.  Abdominal:     General: There is no distension.  Musculoskeletal:     Cervical back: Normal range of motion.     Right lower leg: No edema.     Left lower leg: No edema.     Comments: No deformity of Rle.  Homans sign absent bilaterally, no lower extremity edema, no palpable cords, compartments are soft. Mild swelling posterior right knee. No overlying erythema or warmth. No gross abscess.    Skin:    General: Skin is warm and dry.     Comments: Equal tactile temperature to BLE  Neurological:     Mental Status: He is alert.        LABORATORY DATA I have reviewed the data as listed    Latest Ref Rng & Units 07/28/2023    7:59 AM 06/30/2023   10:10 AM 06/19/2023    3:37 PM  CBC  WBC 4.0 - 10.5 K/uL 4.5  2.2  2.2   Hemoglobin  13.0 - 17.0 g/dL 86.9  88.8  89.1   Hematocrit 39.0 - 52.0 % 37.8  32.1  31.1   Platelets 150 - 400 K/uL 191  205  119         Latest Ref Rng & Units 07/28/2023    7:59 AM 06/30/2023   10:10 AM 06/19/2023    3:37 PM  CMP  Glucose 70 - 99 mg/dL 823  886  860   BUN 6 - 20 mg/dL 11  12  16    Creatinine 0.61 - 1.24 mg/dL 9.14  9.25  9.07   Sodium 135 - 145 mmol/L 139  140  139   Potassium 3.5 - 5.1 mmol/L 4.2  4.1  4.0   Chloride 98 - 111 mmol/L 104  108  105   CO2 22 - 32 mmol/L 28  28  30    Calcium 8.9 - 10.3 mg/dL 9.4  8.9  9.1   Total Protein 6.5 - 8.1 g/dL 6.8  6.2  6.5   Total Bilirubin 0.0 - 1.2 mg/dL 0.5  0.4  0.4   Alkaline Phos 38 - 126 U/L 61  58  65   AST 15 - 41 U/L 16  15  14    ALT 0 - 44 U/L 11  13  15         RADIOGRAPHIC STUDIES (from last 24 hours if applicable) I have personally reviewed the radiological images as listed and agreed with the findings in the report. No results found.      Visit Diagnosis: 1. Small cell carcinoma of upper lobe of right lung (HCC)   2. Right leg pain      No orders of the defined types were placed in this encounter.   All questions were answered. The patient knows to call the clinic with any problems, questions or  concerns. No barriers to learning was detected.  A total of more than 30 minutes were spent on this encounter with face-to-face time and non-face-to-face time, including preparing to see the patient, ordering tests and/or medications, counseling the patient and coordination of care as outlined above.    Thank you for allowing me to participate in the care of this patient.    Kiwan Gadsden E  Walisiewicz, PA-C Department of Hematology/Oncology [facility] at [facility] Phone: [phone]  [phone]    07/29/2023 1:10 PM

## 2023-07-29 NOTE — Patient Instructions (Addendum)
 For your new right leg pain we recommend an ultrasound to rule out a blood clot as the cause. The order has been place. You can call our Central Scheduling Department at [phone].  Your oncology team also recommends you have the CT scan of your chest, abdomen and pelvis as soon as possible because you are having new pain. This scan will help us  to see how your body is responding to treatment and if there is anything new that could be causing your pain.  Please also call the Central Scheduling Department to schedule this scan [phone].   Please let us  know if you have any difficulty scheduling these scans.  If you experience worsening symptoms you need to call 911 or seek evaluation at the nearest emergency department.

## 2023-07-30 ENCOUNTER — Encounter: Payer: Self-pay | Admitting: Internal Medicine

## 2023-07-30 ENCOUNTER — Telehealth: Payer: Self-pay | Admitting: Medical Oncology

## 2023-07-30 ENCOUNTER — Inpatient Hospital Stay: Payer: PRIVATE HEALTH INSURANCE

## 2023-07-30 ENCOUNTER — Ambulatory Visit (HOSPITAL_BASED_OUTPATIENT_CLINIC_OR_DEPARTMENT_OTHER)
Admission: RE | Admit: 2023-07-30 | Discharge: 2023-07-30 | Disposition: A | Discharge status: Home / Self Care | Payer: PRIVATE HEALTH INSURANCE | Source: Ambulatory Visit | Attending: Vascular Surgery | Admitting: Vascular Surgery

## 2023-07-30 ENCOUNTER — Inpatient Hospital Stay: Payer: PRIVATE HEALTH INSURANCE | Admitting: Dietician

## 2023-07-30 VITALS — BP 119/82 | HR 87 | Temp 97.9°F | Resp 18

## 2023-07-30 DIAGNOSIS — C3411 Malignant neoplasm of upper lobe, right bronchus or lung: Secondary | ICD-10-CM

## 2023-07-30 DIAGNOSIS — Z5112 Encounter for antineoplastic immunotherapy: Secondary | ICD-10-CM | POA: Diagnosis not present

## 2023-07-30 DIAGNOSIS — M79604 Pain in right leg: Secondary | ICD-10-CM | POA: Diagnosis not present

## 2023-07-30 MED ORDER — SODIUM CHLORIDE 0.9 % IV SOLN: INTRAVENOUS | Status: DC

## 2023-07-30 MED ORDER — DEXAMETHASONE SODIUM PHOSPHATE 10 MG/ML IJ SOLN
10.0000 mg | Freq: Once | INTRAMUSCULAR | Status: AC
  Administered 2023-07-30: 10 mg via INTRAVENOUS
  Filled 2023-07-30: qty 1

## 2023-07-30 MED ORDER — TRILACICLIB DIHYDROCHLORIDE INJECTION 300 MG
240.0000 mg/m2 | Freq: Once | INTRAVENOUS | Status: AC
  Administered 2023-07-30: 480 mg via INTRAVENOUS
  Filled 2023-07-30: qty 32

## 2023-07-30 MED ORDER — SODIUM CHLORIDE 0.9 % IV SOLN
100.0000 mg/m2 | Freq: Once | INTRAVENOUS | Status: AC
  Administered 2023-07-30: 200 mg via INTRAVENOUS
  Filled 2023-07-30: qty 10

## 2023-07-30 MED ORDER — SODIUM CHLORIDE 0.9% FLUSH
10.0000 mL | INTRAVENOUS | Status: DC | PRN
  Administered 2023-07-30: 10 mL

## 2023-07-30 MED ORDER — HEPARIN SOD (PORK) LOCK FLUSH 100 UNIT/ML IV SOLN
500.0000 [IU] | Freq: Once | INTRAVENOUS | Status: AC | PRN
  Administered 2023-07-30: 500 [IU]

## 2023-07-30 NOTE — Progress Notes (Signed)
 Nutrition Follow-up:  Patient with small cell extensive stage lung cancer. He is receiving chemoimmunotherapy with carboplatin , cosela , etoposide  + durvalumab  q21d. Patient is under the care of Dr. Sherrod  Met with pt in infusion. He is tolerating therapy well thus far without side effects, other than right knee pain. Imaging planned later today to rule out DVT. Patient does report family history of gout. Patient reports he is eating well. 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 Patient eats home cooked meal for dinner. He is drinking 2 Ensure Plus. Patient denies nausea, vomiting, diarrhea, constipation. He is very active on his job. Patient attributes wt loss to increased activity. States weights fluctuate ~5lbs at baseline.    Medications: reviewed   Labs: glucose 176  Anthropometrics: Wt 175 lb 12.8 oz on 7/7  6/9 - 178 lb 11.2 oz 5/12 - 179 lb 6.4 oz   Estimated Energy Needs  Kcals: 2000-2400 Protein: 100-120 Fluid: >2 L  NUTRITION DIAGNOSIS: Inadequate oral intake continues    INTERVENTION:  Continue drinking Ensure Plus/equivalent 2/day - samples + coupons Strive for wt maintenance      MONITORING, EVALUATION, GOAL: wt trends, intake   NEXT VISIT: Monday July 28 during infusion with Joli

## 2023-07-30 NOTE — Telephone Encounter (Signed)
 Eric Foley said he wants to make sure Dr. Sherrod knows that he will take the chemotherapy ,but he  is not taking any more Imfinzi .  I told him to keep his appts July 29th and discuss his concerns  with Dr. Sherrod.

## 2023-07-30 NOTE — Patient Instructions (Signed)
 Per Cassie Heilingoetter, PA, you may try Voltaren topical cream for knee pain.  Medication available over-the-counter.  [facility] - A DEPT OF [facility]  Discharge Instructions: Thank you for choosing [facility] to provide your oncology and hematology care.   If you have a lab appointment with the Cancer Center, please go directly to the Cancer Center and check in at the registration area.   Wear comfortable clothing and clothing appropriate for easy access to any Portacath or PICC line.   We strive to give you quality time with your provider. You may need to reschedule your appointment if you arrive late (15 or more minutes).  Arriving late affects you and other patients whose appointments are after yours.  Also, if you miss three or more appointments without notifying the office, you may be dismissed from the clinic at the provider's discretion.      For prescription refill requests, have your pharmacy contact our office and allow 72 hours for refills to be completed.    Today you received the following chemotherapy and/or immunotherapy agents: Cosela , Etoposide       To help prevent nausea and vomiting after your treatment, we encourage you to take your nausea medication as directed.  BELOW ARE SYMPTOMS THAT SHOULD BE REPORTED IMMEDIATELY: *FEVER GREATER THAN 100.4 F (38 C) OR HIGHER *CHILLS OR SWEATING *NAUSEA AND VOMITING THAT IS NOT CONTROLLED WITH YOUR NAUSEA MEDICATION *UNUSUAL SHORTNESS OF BREATH *UNUSUAL BRUISING OR BLEEDING *URINARY PROBLEMS (pain or burning when urinating, or frequent urination) *BOWEL PROBLEMS (unusual diarrhea, constipation, pain near the anus) TENDERNESS IN MOUTH AND THROAT WITH OR WITHOUT PRESENCE OF ULCERS (sore throat, sores in mouth, or a toothache) UNUSUAL RASH, SWELLING OR PAIN  UNUSUAL VAGINAL DISCHARGE OR ITCHING   Items with * indicate a potential emergency and should be followed up as soon as  possible or go to the Emergency Department if any problems should occur.  Please show the CHEMOTHERAPY ALERT CARD or IMMUNOTHERAPY ALERT CARD at check-in to the Emergency Department and triage nurse.  Should you have questions after your visit or need to cancel or reschedule your appointment, please contact [facility] - A DEPT OF [facility]  Dept: [phone]  and follow the prompts.  Office hours are 8:00 a.m. to 4:30 p.m. Monday - Friday. Please note that voicemails left after 4:00 p.m. may not be returned until the following business day.  We are closed weekends and major holidays. You have access to a nurse at all times for urgent questions. Please call the main number to the clinic Dept: [phone] and follow the prompts.   For any non-urgent questions, you may also contact your provider using MyChart. We now offer e-Visits for anyone 67 and older to request care online for non-urgent symptoms. For details visit [email or web address].   Also download the MyChart app! Go to the app store, search MyChart, open the app, select [place], and log in with your MyChart username and password.

## 2023-07-31 ENCOUNTER — Other Ambulatory Visit: Payer: Self-pay

## 2023-07-31 ENCOUNTER — Telehealth: Payer: Self-pay

## 2023-07-31 ENCOUNTER — Encounter (HOSPITAL_COMMUNITY): Payer: Self-pay

## 2023-07-31 ENCOUNTER — Encounter: Payer: Self-pay | Admitting: Internal Medicine

## 2023-07-31 ENCOUNTER — Ambulatory Visit (HOSPITAL_COMMUNITY): Payer: Self-pay

## 2023-07-31 DIAGNOSIS — C3490 Malignant neoplasm of unspecified part of unspecified bronchus or lung: Secondary | ICD-10-CM

## 2023-07-31 MED ORDER — PROCHLORPERAZINE MALEATE 10 MG PO TABS: 10.0000 mg | ORAL_TABLET | Freq: Four times a day (QID) | ORAL | 2 refills | Status: AC | PRN

## 2023-07-31 NOTE — Telephone Encounter (Signed)
-----   Message from Nurse Mazie H sent at 07/29/2023 10:13 AM EDT ----- Regarding: FW: Dr. Sherrod Salmons from 7/8 to 7/10.  MH ----- Message ----- From: Gladis Odella MATSU, RN Sent: 07/28/2023   2:02 PM EDT To: Onc Triage Nurse [facility] Subject: Dr. Sherrod                                    Pt. first time Durvalumab . Had a mild reaction with lower back pain and feeling hot. We gave him Pepcid , Benadryl , and Solumedrol. He was able to complete treatment without further incident.

## 2023-07-31 NOTE — Telephone Encounter (Signed)
 Spoke with patient this morning regarding Compazine  refill and ultrasound results. Informed patient that Compazine  will be refilled. Reviewed ultrasound results with the patient, explaining that there is no evidence of deep vein thrombosis (DVT) in the right lower extremity and no evidence of common femoral vein obstruction in the left extremity. Patient was pleased with these results.  Patient reports that the pain in his right knee/leg is improving and that he is sleeping better at night. He is currently rotating Tylenol and Ibuprofen and applying Voltaren cream to the right knee/leg, which he states "has helped somewhat."  Patient shared that after receiving Imfinzi  on Monday, he has noticed an "increase in energy and appetite." He also expressed interest in trying a different immunotherapy following the reaction he experienced on Monday. Informed patient that Dr. Sherrod is out this week and that this situation will be discussed upon his return, with further recommendations to follow. Patient voiced understanding and was instructed to call with any worsening concerns or questions.

## 2023-07-31 NOTE — Telephone Encounter (Signed)
 LM for patient that this nurse was calling to see how they were doing after their treatment. Please call back to Dr. Asa Lente nurse at [phone] if they have any questions or concerns regarding the treatment.

## 2023-08-04 ENCOUNTER — Ambulatory Visit: Payer: Self-pay | Admitting: Physician Assistant

## 2023-08-04 ENCOUNTER — Ambulatory Visit: Payer: Self-pay

## 2023-08-04 ENCOUNTER — Encounter: Payer: Self-pay | Admitting: Medical Oncology

## 2023-08-04 ENCOUNTER — Other Ambulatory Visit: Payer: Self-pay

## 2023-08-04 ENCOUNTER — Telehealth: Payer: Self-pay | Admitting: Medical Oncology

## 2023-08-04 NOTE — Telephone Encounter (Signed)
 Pain persists-R knee, ankle and hip pain described as nerve -like . He is using Voltaren gel, salon pass. If he starts walking the pain gets worse.    He still  needs to schedule a CT scan. F/U 7/29  He requested a letter to excuse him from work today. Letter placed up at check -in desk.

## 2023-08-05 ENCOUNTER — Inpatient Hospital Stay: Payer: PRIVATE HEALTH INSURANCE

## 2023-08-05 NOTE — Progress Notes (Signed)
  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 (as documented in provider EOT note)   The patient was available for call today.   Symptoms of fatigue have improved since completing therapy.  Symptoms of skin changes have improved since completing therapy.  Symptoms of esophagitis have improved since completing therapy.   The patient has scheduled follow up with his medical oncologist Dr. Sherrod  for ongoing care, and was encouraged to call if he  develops concerns or questions regarding radiation.   This concludes the interaction.  Rosaline Minerva, LPN

## 2023-08-07 ENCOUNTER — Ambulatory Visit (HOSPITAL_BASED_OUTPATIENT_CLINIC_OR_DEPARTMENT_OTHER): Payer: PRIVATE HEALTH INSURANCE

## 2023-08-11 ENCOUNTER — Ambulatory Visit: Payer: PRIVATE HEALTH INSURANCE | Admitting: Physician Assistant

## 2023-08-11 ENCOUNTER — Ambulatory Visit: Payer: PRIVATE HEALTH INSURANCE

## 2023-08-11 ENCOUNTER — Other Ambulatory Visit: Payer: PRIVATE HEALTH INSURANCE

## 2023-08-12 ENCOUNTER — Other Ambulatory Visit: Payer: Self-pay

## 2023-08-12 ENCOUNTER — Ambulatory Visit
Admission: RE | Admit: 2023-08-12 | Discharge: 2023-08-12 | Disposition: A | Discharge status: Home / Self Care | Payer: PRIVATE HEALTH INSURANCE | Source: Ambulatory Visit | Attending: Internal Medicine | Admitting: Internal Medicine

## 2023-08-14 ENCOUNTER — Encounter: Payer: Self-pay | Admitting: Internal Medicine

## 2023-08-14 ENCOUNTER — Ambulatory Visit (HOSPITAL_COMMUNITY)
Admission: RE | Admit: 2023-08-14 | Discharge: 2023-08-14 | Disposition: A | Discharge status: Home / Self Care | Payer: PRIVATE HEALTH INSURANCE | Source: Ambulatory Visit | Attending: Physician Assistant | Admitting: Physician Assistant

## 2023-08-14 DIAGNOSIS — C3411 Malignant neoplasm of upper lobe, right bronchus or lung: Secondary | ICD-10-CM | POA: Insufficient documentation

## 2023-08-14 MED ORDER — HEPARIN SOD (PORK) LOCK FLUSH 100 UNIT/ML IV SOLN
500.0000 [IU] | Freq: Once | INTRAVENOUS | Status: AC
  Administered 2023-08-14: 500 [IU] via INTRAVENOUS

## 2023-08-14 MED ORDER — HEPARIN SOD (PORK) LOCK FLUSH 100 UNIT/ML IV SOLN: 500.0000 [IU] | Freq: Once | INTRAVENOUS | Status: DC

## 2023-08-14 MED ORDER — IOHEXOL 300 MG/ML  SOLN
100.0000 mL | Freq: Once | INTRAMUSCULAR | Status: AC | PRN
  Administered 2023-08-14: 100 mL via INTRAVENOUS

## 2023-08-15 ENCOUNTER — Encounter: Payer: Self-pay | Admitting: Internal Medicine

## 2023-08-15 NOTE — Progress Notes (Signed)
 [facility] OFFICE PROGRESS NOTE  Patient, Eric Foley  DIAGNOSIS:  Extensive stage Small cell lung cancer. He presented with a large right upper lobe lung mass with mediastinal invasion, right pleural/subpleural involvement, left supraclavicular adenopathy, and possible pelvic peritoneal nodule. He was diagnosed in March 2025.   PRIOR THERAPY: 1)  Radiation to the large right lung mass and pelvis under the care of Dr. Patrcia last dose on 07/11/23  2) palliative systemic chemotherapy with carboplatin  for an AUC of 5 and etoposide  100 mg/m on days 1, 2, and 3 IV every 3 weeks and Imfinzi  once he completes his radiation to the chest. First dose on 05/12/23. He also receives Cosela  for myeloprotection. Status post 3 cycles of treatment. Discontinued due to disease progression.   CURRENT THERAPY: Second line chemotherapy with zepzelca IV every 3 weeks starting on September 15, 2023 per patient request.   INTERVAL HISTORY: Eric Foley 53 y.o. male returns to the clinic today for a follow-up visit.  The patient was last seen on 07/28/23. At that time, he started cycle #3 of treatment. That was his first time having the immunotherapy in his care plan. while in the infusion room he reported feeling hot without diaphoresis as well as low back pain. He received benadryl . Then for two subsequent days, he was endorsing knee pain. His DVT study was negative. His leg pain eventually improved about 1 week ago. He was using tylenol and NSAIDs. He used salonpas as well as voltaren gel.   He also has not been having back pain recently.   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 He will be gone starting next week and won't be returning until 8/22. He understands that we would not recommend driving at this time or waiting until late August to start treatment.   The patient is feeling fairly well today.  He denies any fever, chills, or night sweats.  His weight and appetite is good. He denies abdominal pain.  He denies breathing concerns. He denies any shortness of breath, cough, swelling, hemoptysis, chest pain, nausea, vomiting, diarrhea, constipation, headaches, or vision changes. He recently had a restaging CT scan. He is here for evaluation and repeat blood work before undergoing cycle #4.    MEDICAL HISTORY: Past Medical History:  Diagnosis Date   COPD (chronic obstructive pulmonary disease) (HCC)    History of kidney stones     ALLERGIES:  is allergic to penicillins, amoxicillin, ampicillin-sulbactam sodium, and durvalumab .  MEDICATIONS:  Current Outpatient Medications  Medication Sig Dispense Refill   albuterol  (VENTOLIN  HFA) 108 (90 Base) MCG/ACT inhaler Inhale 2 puffs into the lungs every 6 (six) hours as needed for wheezing or shortness of breath. (Patient not taking: Reported on 06/30/2023) 8 g 6   Aspirin-Acetaminophen (GOODYS BODY PAIN PO) Take by mouth 2 (two) times daily.     lidocaine -prilocaine  (EMLA ) cream Apply 1 Application topically as needed. 30 g 2   Potassium 99 MG TABS Take 99 mg by mouth 2 (two) times daily.     prochlorperazine  (COMPAZINE ) 10 MG tablet Take 1 tablet (10 mg total) by mouth every 6 (six) hours as needed. 30 tablet 2   Tiotropium Bromide-Olodaterol (STIOLTO RESPIMAT ) 2.5-2.5 MCG/ACT AERS Inhale 2 puffs into the lungs daily. (Patient not taking: Reported on 06/30/2023) 4 g 11   Eric current facility-administered medications for this visit.    SURGICAL HISTORY:  Past Surgical  History:  Procedure Laterality Date   BRONCHIAL BIOPSY  04/22/2023   Procedure: BRONCHOSCOPY, WITH BIOPSY;  Surgeon: Shelah Lamar RAMAN, MD;  Location: [facility];  Service: Pulmonary;;   BRONCHIAL BRUSHINGS  04/22/2023   Procedure: BRONCHOSCOPY, WITH BRUSH BIOPSY;  Surgeon: Shelah Lamar RAMAN, MD;  Location: [facility] ENDOSCOPY;  Service: Pulmonary;;   FLEXIBLE BRONCHOSCOPY  04/22/2023   Procedure: ELLIOTT SIDE;  Surgeon: Shelah Lamar RAMAN, MD;  Location: [facility] ENDOSCOPY;  Service: Pulmonary;;   HEMOSTASIS CLIP PLACEMENT  04/22/2023   Procedure: CONTROL OF HEMORRHAGE, lung;  Surgeon: Shelah Lamar RAMAN, MD;  Location: [facility] ENDOSCOPY;  Service: Pulmonary;;   IR IMAGING GUIDED PORT INSERTION  05/16/2023   TONSILLECTOMY      REVIEW OF SYSTEMS:   Review of Systems  Constitutional: Negative for appetite change, chills, fatigue, fever and unexpected weight change.  HENT: Negative for mouth sores, nosebleeds, sore throat and trouble swallowing.   Eyes: Negative for eye problems and icterus.  Respiratory: Negative for cough, hemoptysis, shortness of breath and wheezing.   Cardiovascular: Negative for chest pain and leg swelling.  Gastrointestinal: Negative for abdominal pain, constipation, diarrhea, nausea and vomiting.  Genitourinary: Negative for bladder incontinence, difficulty urinating, dysuria, frequency and hematuria.   Musculoskeletal: Improved leg pain. Negative for back pain, gait problem, neck pain and neck stiffness.  Skin: Negative for itching and rash.  Neurological: Negative for dizziness, extremity weakness, gait problem, headaches, light-headedness and seizures.  Hematological: Negative for adenopathy. Does not bruise/bleed easily.  Psychiatric/Behavioral: Negative for confusion, depression and sleep disturbance. The patient is not nervous/anxious.    PHYSICAL EXAMINATION:  There were Eric vitals taken for this visit.  ECOG PERFORMANCE STATUS: 1  Physical Exam  Constitutional: Oriented to person, place, and time and well-developed, well-nourished, and in Eric distress.  HENT:  Head: Normocephalic and atraumatic.  Mouth/Throat: Oropharynx is clear and moist. Eric oropharyngeal exudate.  Eyes: Conjunctivae are normal. Right eye exhibits Eric discharge. Left eye exhibits Eric discharge. Eric scleral icterus.  Neck: Normal range of motion. Neck supple.  Cardiovascular: Normal rate, regular rhythm, normal heart sounds and  intact distal pulses.   Pulmonary/Chest: Effort normal. Quiet breath sounds bilaterally. Eric respiratory distress. Eric wheezes. Eric rales.  Abdominal: Soft. Bowel sounds are normal. Exhibits Eric distension and Eric mass. There is Eric tenderness.  Musculoskeletal: Normal range of motion. Exhibits Eric edema.  Lymphadenopathy:    Eric cervical adenopathy.  Neurological: Alert and oriented to person, place, and time. Exhibits normal muscle tone. Gait normal. Coordination normal.  Skin: Skin is warm and dry. Eric rash noted. Not diaphoretic. Eric erythema. Eric pallor.  Psychiatric: Mood, memory and judgment normal.  Vitals reviewed. LABORATORY DATA: Lab Results  Component Value Date   WBC 4.5 07/28/2023   HGB 13.0 07/28/2023   HCT 37.8 (L) 07/28/2023   MCV 96.7 07/28/2023   PLT 191 07/28/2023      Chemistry      Component Value Date/Time   NA 139 07/28/2023 0759   K 4.2 07/28/2023 0759   CL 104 07/28/2023 0759   CO2 28 07/28/2023 0759   BUN 11 07/28/2023 0759   CREATININE 0.85 07/28/2023 0759      Component Value Date/Time   CALCIUM 9.4 07/28/2023 0759   ALKPHOS 61 07/28/2023 0759   AST 16 07/28/2023 0759   ALT 11 07/28/2023 0759   BILITOT 0.5 07/28/2023 0759       RADIOGRAPHIC STUDIES:  VAS US  LOWER EXTREMITY VENOUS (DVT) Result Date: 07/30/2023  Lower Venous DVT Study Patient Name:  BRETTON TANDY  Date of Exam:   07/30/2023 Medical Rec #: [id number]        Accession #:    [id number] Date of Birth: May 12, 1970         Patient Gender: M Patient Age:   66 years Exam Location:  [address] Procedure:      VAS US  LOWER EXTREMITY VENOUS (DVT) Referring Phys: KAITLYN WALISIEWICZ --------------------------------------------------------------------------------  Indications: Pain.  Risk Factors: Recent extended travel Cancer Lung. Comparison Study: None. Performing Technologist: Garnette Rockers  Examination Guidelines: A complete evaluation includes B-mode imaging, spectral Doppler, color Doppler, and  power Doppler as needed of all accessible portions of each vessel. Bilateral testing is considered an integral part of a complete examination. Limited examinations for reoccurring indications may be performed as noted. The reflux portion of the exam is performed with the patient in reverse Trendelenburg.  +---------+---------------+---------+-----------+----------+--------------+ RIGHT    CompressibilityPhasicitySpontaneityPropertiesThrombus Aging +---------+---------------+---------+-----------+----------+--------------+ CFV      Full           Yes      Yes                                 +---------+---------------+---------+-----------+----------+--------------+ SFJ      Full                                                        +---------+---------------+---------+-----------+----------+--------------+ FV Prox  Full                                                        +---------+---------------+---------+-----------+----------+--------------+ FV Mid   Full                                                        +---------+---------------+---------+-----------+----------+--------------+ FV DistalFull                                                        +---------+---------------+---------+-----------+----------+--------------+ PFV      Full                                                        +---------+---------------+---------+-----------+----------+--------------+ POP      Full           Yes      Yes                                 +---------+---------------+---------+-----------+----------+--------------+ PTV      Full  Yes                                 +---------+---------------+---------+-----------+----------+--------------+ PERO     Full                    Yes                                 +---------+---------------+---------+-----------+----------+--------------+    +----+---------------+---------+-----------+----------+--------------+ LEFTCompressibilityPhasicitySpontaneityPropertiesThrombus Aging +----+---------------+---------+-----------+----------+--------------+ CFV Full           Yes      Yes                                 +----+---------------+---------+-----------+----------+--------------+     Summary: RIGHT: - There is Eric evidence of deep vein thrombosis in the lower extremity.  - Eric cystic structure found in the popliteal fossa.  LEFT: - Eric evidence of common femoral vein obstruction.   *See table(s) above for measurements and observations. Electronically signed by Debby Robertson on 07/30/2023 at 5:05:46 PM.    Final      ASSESSMENT/PLAN:  This is a very pleasant 53 year old Caucasian male diagnosed with at least stage IIIC Small cell lung cancer (T4, N3, M0).  He presented with a large right upper lobe lung mass with mediastinal invasion, right pleural/subpleural involvement, left supraclavicular adenopathy, and possible pelvic peritoneal nodule.  He was diagnosed in March 2025.   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    He started palliative  systemic chemotherapy with carboplatin  for an AUC of 5 day 1 and etoposide  100 mg/m on day 1, 2, and 3 on 05/12/23. He is status post 3 cycles.  Immunotherapy will be added starting from cycle #3 since he has completed his radiation.  The patient was seen with Dr. Sherrod today.  Dr. Sherrod personally and independently reviewed the scan and discussed results with the patient today.  The scan showed improved disease in his lung but he had disease progression in the abdomen.  Specifically, the large right sided mediastinal, hilar, and mass is markedly improved today.  There is also significant improvement in the nodularity in the right side of the pleura.  The previous esophageal lesion is not  appreciated today.  However there is significant disease progression in the abdomen with bilateral adrenal masses, focal lower pole left-sided renal mass, as well as several peritoneal and soft tissue nodules in the abdomen and pelvis.    Notably, the patient has not consistently received chemotherapy at the recommended intervals due to prioritization of work commitments, which may have contributed to disease progression.  Dr. Sherrod discussed the options.  Dr. Sherrod recommends discontinuing his current treatment at this time as he has had significant progression in the abdomen with his current treatment..  Dr. Sherrod discussed options.  Of course palliative care and hospice is always an option with stage IV small cell lung cancer.  All treatment is palliative in nature.  The other options include referral to another academic institution that offers treatment with Tarlatamab.  This may offer clinical benefit but requires strict adherence to treatment intervals, which the patient expressed concerns about due to work-related travel.  Dr. Sherrod explained that is likely the most  effective option.  The third option is systemic chemotherapy with Zepzelca once every 3 weeks.  The patient has opted for treatment with Zepzelca.  Despite our recommendation to begin treatment promptly (can start next week), the patient plans to be out of town for work and unavailable for treatment until approximately September 15, 2023. We have clearly advised the patient that this delay is not medically advisable given the aggressive nature of small cell lung cancer and the risk of further rapid disease progression. The patient acknowledged understanding of these risks.  Additionally, due to his serious and progressive illness, we strongly recommend against long-distance driving, particularly out of state. The patient understands this is our professional opinion and accepts the associated risks. He has previously spoken with [employer].about his financial concerns.   We will tentatively plan to initiate Zepzelca treatment upon his return around 09/15/2023. The patient was advised to seek immediate medical attention, including emergency room evaluation, for any new or worsening symptoms in the interim--particularly while traveling or out of state.  We will see him with his first dose of treatment in 4 weeks.   The patient was advised to call immediately if he has any concerning symptoms in the interval. The patient voices understanding of current disease status and treatment options and is in agreement with the current care plan. All questions were answered. The patient knows to call the clinic with any problems, questions or concerns. We can certainly see the patient much sooner if necessary  Eric orders of the defined types were placed in this encounter.   Eric Okray L Jacquel Mccamish, PA-C 08/15/23  ADDENDUM: Hematology/Oncology Attending:  I had a face-to-face encounter with the patient today.  I reviewed his records, lab, scan and recommended his care plan.  This is a very pleasant 53 years old white male with extensive stage small cell lung cancer diagnosed in March 2025 status post radiation to the large right lung mass as well as the pelvis.  The patient then started palliative systemic chemotherapy with carboplatin , etoposide  and Imfinzi  status post 3 cycles of treatment but he was not very compliant with the dates of his treatment.  He has been skipping treatment for his work. He had repeat CT scan of the chest, abdomen and pelvis performed recently.  I personally and independently reviewed the scan images and discussed the result and showed the images to the patient today.  His scan showed mixed response with significant improvement in the large right sided mediastinal, hilar and lung mass as well as other nodes and nodularity in the right side of the pleura however there was increasing disease in the abdomen and pelvis  including bilateral adrenal masses and focal lower pole left-sided renal mass as well as several peritoneal and soft tissue nodules throughout the abdomen and pelvis. I had a lengthy discussion with the patient today about his condition.  I recommended for him change of treatment and strongly recommended for him consideration of treatment with Tarlatamab at [facility] or one of the other tertiary center close to us  for the initial few cycles followed by maintenance treatment at [facility].  The patient mentions that he would not be able to to do the treatment with Tarlatamab every 2 weeks because of his work schedule.  I gave him the other option of treatment with systemic chemotherapy with second line Lurbinectedin every 3 weeks.  The patient is interested in this option because it fits with his work schedule.  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 I recommended for him to start the treatment next week but he mentions that he will be out of town for the next 4 weeks and he preferred to start his treatment on September 15, 2023.  He understands that his disease would continue to progress during this time. The patient understands that this treatment is palliative in nature and there is Eric cure for his condition.  The goal of the treatment would be palliation of his symptoms and prolongation of his life. Will see him back for follow-up visit with the first day of his treatment with Lurbinectedin in 4 weeks. He was advised to call immediately if he has any other concerning symptoms in the interval.  He was also advised to go to the local hospital if he is symptomatic and traveling outside the state. The total time spent in the appointment was 55 minutes including review of chart and various tests results, discussions about plan of care and coordination of care plan . Disclaimer: This note was  dictated with voice recognition software. Similar sounding words can inadvertently be transcribed and may be missed upon review. Sherrod MARLA Sherrod, MD

## 2023-08-16 ENCOUNTER — Other Ambulatory Visit: Payer: Self-pay

## 2023-08-18 ENCOUNTER — Other Ambulatory Visit: Payer: PRIVATE HEALTH INSURANCE

## 2023-08-18 ENCOUNTER — Telehealth: Payer: Self-pay

## 2023-08-18 ENCOUNTER — Encounter: Payer: Self-pay | Admitting: Internal Medicine

## 2023-08-18 ENCOUNTER — Ambulatory Visit: Payer: PRIVATE HEALTH INSURANCE

## 2023-08-18 ENCOUNTER — Ambulatory Visit: Payer: PRIVATE HEALTH INSURANCE | Admitting: Internal Medicine

## 2023-08-18 MED FILL — Fosaprepitant Dimeglumine For IV Infusion 150 MG (Base Eq): INTRAVENOUS | Qty: 5 | Status: AC

## 2023-08-18 NOTE — Telephone Encounter (Signed)
 Patient called in about CT scan results.  [facility]iology and requested scan to be read per patient request.  He voiced understanding.

## 2023-08-19 ENCOUNTER — Inpatient Hospital Stay: Payer: PRIVATE HEALTH INSURANCE

## 2023-08-19 ENCOUNTER — Encounter: Payer: Self-pay | Admitting: Internal Medicine

## 2023-08-19 ENCOUNTER — Inpatient Hospital Stay (HOSPITAL_BASED_OUTPATIENT_CLINIC_OR_DEPARTMENT_OTHER): Payer: PRIVATE HEALTH INSURANCE | Admitting: Physician Assistant

## 2023-08-19 VITALS — BP 121/66 | HR 50 | Temp 98.2°F | Resp 14 | Wt 177.7 lb

## 2023-08-19 DIAGNOSIS — Z5112 Encounter for antineoplastic immunotherapy: Secondary | ICD-10-CM | POA: Diagnosis not present

## 2023-08-19 DIAGNOSIS — C3411 Malignant neoplasm of upper lobe, right bronchus or lung: Secondary | ICD-10-CM

## 2023-08-19 DIAGNOSIS — Z95828 Presence of other vascular implants and grafts: Secondary | ICD-10-CM

## 2023-08-19 DIAGNOSIS — Z7189 Other specified counseling: Secondary | ICD-10-CM | POA: Diagnosis not present

## 2023-08-19 LAB — CBC WITH DIFFERENTIAL (CANCER CENTER ONLY)
Abs Immature Granulocytes: 0.18 K/uL — ABNORMAL HIGH (ref 0.00–0.07)
Basophils Absolute: 0 K/uL (ref 0.0–0.1)
Basophils Relative: 1 %
Eosinophils Absolute: 0 K/uL (ref 0.0–0.5)
Eosinophils Relative: 1 %
HCT: 29.6 % — ABNORMAL LOW (ref 39.0–52.0)
Hemoglobin: 10 g/dL — ABNORMAL LOW (ref 13.0–17.0)
Immature Granulocytes: 5 %
Lymphocytes Relative: 9 %
Lymphs Abs: 0.3 K/uL — ABNORMAL LOW (ref 0.7–4.0)
MCH: 32.6 pg (ref 26.0–34.0)
MCHC: 33.8 g/dL (ref 30.0–36.0)
MCV: 96.4 fL (ref 80.0–100.0)
Monocytes Absolute: 0.7 K/uL (ref 0.1–1.0)
Monocytes Relative: 20 %
Neutro Abs: 2.2 K/uL (ref 1.7–7.7)
Neutrophils Relative %: 64 %
Platelet Count: 190 K/uL (ref 150–400)
RBC: 3.07 MIL/uL — ABNORMAL LOW (ref 4.22–5.81)
RDW: 16.3 % — ABNORMAL HIGH (ref 11.5–15.5)
WBC Count: 3.5 K/uL — ABNORMAL LOW (ref 4.0–10.5)
nRBC: 0 % (ref 0.0–0.2)

## 2023-08-19 LAB — CMP (CANCER CENTER ONLY)
ALT: 10 U/L (ref 0–44)
AST: 15 U/L (ref 15–41)
Albumin: 3.9 g/dL (ref 3.5–5.0)
Alkaline Phosphatase: 70 U/L (ref 38–126)
Anion gap: 5 (ref 5–15)
BUN: 14 mg/dL (ref 6–20)
CO2: 28 mmol/L (ref 22–32)
Calcium: 9.1 mg/dL (ref 8.9–10.3)
Chloride: 108 mmol/L (ref 98–111)
Creatinine: 0.91 mg/dL (ref 0.61–1.24)
GFR, Estimated: >60 mL/min (ref >60)
Glucose, Bld: 106 mg/dL — ABNORMAL HIGH (ref 70–99)
Potassium: 4 mmol/L (ref 3.5–5.1)
Sodium: 141 mmol/L (ref 135–145)
Total Bilirubin: 0.3 mg/dL (ref 0.0–1.2)
Total Protein: 6.7 g/dL (ref 6.5–8.1)

## 2023-08-19 MED ORDER — SODIUM CHLORIDE 0.9% FLUSH
10.0000 mL | Freq: Once | INTRAVENOUS | Status: AC
Start: 2023-08-19 — End: 2023-08-19
  Administered 2023-08-19: 10 mL

## 2023-08-19 NOTE — Progress Notes (Signed)
 DISCONTINUE ON PATHWAY REGIMEN - Small Cell Lung     Cycles 1 through 4: A cycle is every 21 days:     Durvalumab      Carboplatin      Etoposide    Cycles 5 and beyond: A cycle is every 28 days:     Durvalumab   **Always confirm dose/schedule in your pharmacy ordering system**  PRIOR TREATMENT: LOS420: Durvalumab 1,500 mg D1 + Carboplatin AUC=5 D1 + Etoposide 100 mg/m2 D1-3 q21 Days x 4 Cycles, Followed by Durvalumab 1,500 mg q28 Days Until Progression or Unacceptable Toxicity  START ON PATHWAY REGIMEN - Small Cell Lung     A cycle is every 21 days:     Lurbinectedin   **Always confirm dose/schedule in your pharmacy ordering system**  Patient Characteristics: Relapsed or Progressive Disease, Second Line, Relapse ? 6 Months Therapeutic Status: Relapsed or Progressive Disease Line of Therapy: Second Line Time to Relapse: Relapse ? 6 Months Intent of Therapy: Non-Curative / Palliative Intent, Discussed with Patient

## 2023-08-20 ENCOUNTER — Inpatient Hospital Stay: Payer: PRIVATE HEALTH INSURANCE

## 2023-08-21 ENCOUNTER — Encounter: Payer: Self-pay | Admitting: Internal Medicine

## 2023-08-21 ENCOUNTER — Inpatient Hospital Stay: Payer: PRIVATE HEALTH INSURANCE

## 2023-08-21 ENCOUNTER — Inpatient Hospital Stay: Payer: PRIVATE HEALTH INSURANCE | Admitting: Nutrition

## 2023-08-21 ENCOUNTER — Other Ambulatory Visit: Payer: Self-pay | Admitting: Physician Assistant

## 2023-08-22 ENCOUNTER — Encounter: Payer: Self-pay | Admitting: Internal Medicine

## 2023-08-22 ENCOUNTER — Telehealth: Payer: Self-pay | Admitting: Medical Oncology

## 2023-08-22 NOTE — Telephone Encounter (Signed)
 Infusion appts . He said he got it worked out where he can start next week . Asking for appts. Message sent to scheduler.

## 2023-08-23 ENCOUNTER — Other Ambulatory Visit: Payer: Self-pay

## 2023-08-26 ENCOUNTER — Other Ambulatory Visit: Payer: Self-pay

## 2023-08-28 ENCOUNTER — Other Ambulatory Visit: Payer: PRIVATE HEALTH INSURANCE

## 2023-08-28 ENCOUNTER — Ambulatory Visit: Payer: PRIVATE HEALTH INSURANCE

## 2023-08-29 ENCOUNTER — Inpatient Hospital Stay: Payer: PRIVATE HEALTH INSURANCE | Attending: Internal Medicine

## 2023-08-29 ENCOUNTER — Inpatient Hospital Stay: Payer: PRIVATE HEALTH INSURANCE

## 2023-08-29 VITALS — BP 131/73 | HR 86 | Temp 97.9°F | Resp 16 | Ht 72.0 in | Wt 177.0 lb

## 2023-08-29 DIAGNOSIS — Z5111 Encounter for antineoplastic chemotherapy: Secondary | ICD-10-CM | POA: Insufficient documentation

## 2023-08-29 DIAGNOSIS — Z95828 Presence of other vascular implants and grafts: Secondary | ICD-10-CM

## 2023-08-29 DIAGNOSIS — C3411 Malignant neoplasm of upper lobe, right bronchus or lung: Secondary | ICD-10-CM | POA: Insufficient documentation

## 2023-08-29 LAB — CBC WITH DIFFERENTIAL (CANCER CENTER ONLY)
Abs Immature Granulocytes: 0.03 K/uL (ref 0.00–0.07)
Basophils Absolute: 0 K/uL (ref 0.0–0.1)
Basophils Relative: 1 %
Eosinophils Absolute: 0 K/uL (ref 0.0–0.5)
Eosinophils Relative: 0 %
HCT: 34.6 % — ABNORMAL LOW (ref 39.0–52.0)
Hemoglobin: 11.6 g/dL — ABNORMAL LOW (ref 13.0–17.0)
Immature Granulocytes: 1 %
Lymphocytes Relative: 12 %
Lymphs Abs: 0.5 K/uL — ABNORMAL LOW (ref 0.7–4.0)
MCH: 32.5 pg (ref 26.0–34.0)
MCHC: 33.5 g/dL (ref 30.0–36.0)
MCV: 96.9 fL (ref 80.0–100.0)
Monocytes Absolute: 0.8 K/uL (ref 0.1–1.0)
Monocytes Relative: 22 %
Neutro Abs: 2.4 K/uL (ref 1.7–7.7)
Neutrophils Relative %: 64 %
Platelet Count: 222 K/uL (ref 150–400)
RBC: 3.57 MIL/uL — ABNORMAL LOW (ref 4.22–5.81)
RDW: 17.1 % — ABNORMAL HIGH (ref 11.5–15.5)
WBC Count: 3.7 K/uL — ABNORMAL LOW (ref 4.0–10.5)
nRBC: 0 % (ref 0.0–0.2)

## 2023-08-29 LAB — CMP (CANCER CENTER ONLY)
ALT: 12 U/L (ref 0–44)
AST: 17 U/L (ref 15–41)
Albumin: 4.3 g/dL (ref 3.5–5.0)
Alkaline Phosphatase: 74 U/L (ref 38–126)
Anion gap: 5 (ref 5–15)
BUN: 15 mg/dL (ref 6–20)
CO2: 30 mmol/L (ref 22–32)
Calcium: 9.2 mg/dL (ref 8.9–10.3)
Chloride: 106 mmol/L (ref 98–111)
Creatinine: 0.88 mg/dL (ref 0.61–1.24)
GFR, Estimated: >60 mL/min (ref >60)
Glucose, Bld: 102 mg/dL — ABNORMAL HIGH (ref 70–99)
Potassium: 4 mmol/L (ref 3.5–5.1)
Sodium: 141 mmol/L (ref 135–145)
Total Bilirubin: 0.4 mg/dL (ref 0.0–1.2)
Total Protein: 7 g/dL (ref 6.5–8.1)

## 2023-08-29 LAB — CK: Total CK: 25 U/L — ABNORMAL LOW (ref 49–397)

## 2023-08-29 MED ORDER — SODIUM CHLORIDE 0.9% FLUSH
10.0000 mL | Freq: Once | INTRAVENOUS | Status: AC
  Administered 2023-08-29: 10 mL

## 2023-08-29 MED ORDER — SODIUM CHLORIDE 0.9 % IV SOLN
3.2000 mg/m2 | Freq: Once | INTRAVENOUS | Status: AC
  Administered 2023-08-29: 6.45 mg via INTRAVENOUS
  Filled 2023-08-29: qty 12.9

## 2023-08-29 MED ORDER — PALONOSETRON HCL INJECTION 0.25 MG/5ML
0.2500 mg | Freq: Once | INTRAVENOUS | Status: AC
  Administered 2023-08-29: 0.25 mg via INTRAVENOUS
  Filled 2023-08-29: qty 5

## 2023-08-29 MED ORDER — SODIUM CHLORIDE 0.9 % IV SOLN
INTRAVENOUS | Status: DC
Start: 2023-08-29 — End: 2023-08-29

## 2023-08-29 MED ORDER — DEXAMETHASONE SODIUM PHOSPHATE 10 MG/ML IJ SOLN
10.0000 mg | Freq: Once | INTRAMUSCULAR | Status: AC
  Administered 2023-08-29: 10 mg via INTRAVENOUS
  Filled 2023-08-29: qty 1

## 2023-08-29 NOTE — Patient Instructions (Signed)
 [facility] - A DEPT OF [facility]  Discharge Instructions: Thank you for choosing [facility] to provide your oncology and hematology care.   If you have a lab appointment with the Cancer Center, please go directly to the Cancer Center and check in at the registration area.   Wear comfortable clothing and clothing appropriate for easy access to any Portacath or PICC line.   We strive to give you quality time with your provider. You may need to reschedule your appointment if you arrive late (15 or more minutes).  Arriving late affects you and other patients whose appointments are after yours.  Also, if you miss three or more appointments without notifying the office, you may be dismissed from the clinic at the provider's discretion.      For prescription refill requests, have your pharmacy contact our office and allow 72 hours for refills to be completed.    Today you received the following chemotherapy and/or immunotherapy agents    Lurbinectedin  (Zepzelca )   To help prevent nausea and vomiting after your treatment, we encourage you to take your nausea medication as directed.  BELOW ARE SYMPTOMS THAT SHOULD BE REPORTED IMMEDIATELY: *FEVER GREATER THAN 100.4 F (38 C) OR HIGHER *CHILLS OR SWEATING *NAUSEA AND VOMITING THAT IS NOT CONTROLLED WITH YOUR NAUSEA MEDICATION *UNUSUAL SHORTNESS OF BREATH *UNUSUAL BRUISING OR BLEEDING *URINARY PROBLEMS (pain or burning when urinating, or frequent urination) *BOWEL PROBLEMS (unusual diarrhea, constipation, pain near the anus) TENDERNESS IN MOUTH AND THROAT WITH OR WITHOUT PRESENCE OF ULCERS (sore throat, sores in mouth, or a toothache) UNUSUAL RASH, SWELLING OR PAIN  UNUSUAL VAGINAL DISCHARGE OR ITCHING   Items with * indicate a potential emergency and should be followed up as soon as possible or go to the Emergency Department if any problems should occur.  Please show the CHEMOTHERAPY ALERT CARD or  IMMUNOTHERAPY ALERT CARD at check-in to the Emergency Department and triage nurse.  Should you have questions after your visit or need to cancel or reschedule your appointment, please contact [facility] - A DEPT OF [facility]  Dept: [phone]  and follow the prompts.  Office hours are 8:00 a.m. to 4:30 p.m. Monday - Friday. Please note that voicemails left after 4:00 p.m. may not be returned until the following business day.  We are closed weekends and major holidays. You have access to a nurse at all times for urgent questions. Please call the main number to the clinic Dept: [phone] and follow the prompts.   For any non-urgent questions, you may also contact your provider using MyChart. We now offer e-Visits for anyone 74 and older to request care online for non-urgent symptoms. For details visit [email or web address].   Also download the MyChart app! Go to the app store, search MyChart, open the app, select [facility], and log in with your MyChart username and password.  Lurbinectedin  Injection What is this medication? LURBINECTEDIN  (LOOR bin EK te din) treats lung cancer. It works by slowing down the growth of cancer cells. This medicine may be used for other purposes; ask your health care provider or pharmacist if you have questions. COMMON BRAND NAME(S): ZEPZELCA  What should I tell my care team before I take this medication? They need to know if you have any of these conditions: Liver disease Low blood cell levels, such as low white cells, platelets, red blood cells An unusual or allergic reaction  to lurbinectedin , other medications, foods, dyes, or preservatives If you or your partner are pregnant or trying to get pregnant Breastfeeding How should I use this medication? This medication is injected into a vein. It is given by your care team in a hospital or clinic setting. Talk to your care team about the use of this medication in children.  Special care may be needed. Overdosage: If you think you have taken too much of this medicine contact a poison control center or emergency room at once. NOTE: This medicine is only for you. Do not share this medicine with others. What if I miss a dose? Keep appointments for follow-up doses. It is important not to miss your dose. Call your care team if you are unable to keep an appointment. What may interact with this medication? Grapefruit juice or [place] oranges Other medications may affect the way this medication works. Talk with your care team about all of the medications you take. They may suggest changes to your treatment plan to lower the risk of side effects and to make sure your medications work as intended. This list may not describe all possible interactions. Give your health care provider a list of all the medicines, herbs, non-prescription drugs, or dietary supplements you use. Also tell them if you smoke, drink alcohol, or use illegal drugs. Some items may interact with your medicine. What should I watch for while using this medication? Your condition will be monitored carefully while you are receiving this medication. This medication may make you feel generally unwell. This is not uncommon as chemotherapy can affect healthy cells as well as cancer cells. Report any side effects. Continue your course of treatment even though you feel ill unless your care team tells you to stop. This medication may increase your risk of getting an infection. Call your care team for advice if you get a fever, chills, sore throat, or other symptoms of a cold or flu. Do not treat yourself. Try to avoid being around people who are sick. Avoid taking medications that contain aspirin, acetaminophen, ibuprofen, naproxen, or ketoprofen unless instructed by your care team. These medications may hide a fever. Be careful brushing or flossing your teeth or using a toothpick because you may get an infection or bleed  more easily. If you have any dental work done, tell your dentist you are receiving this medication. Talk to your care team if you may be pregnant. Serious birth defects can occur if you take this medication during pregnancy and for 6 months after the last dose. You will need a negative pregnancy test before starting this medication. Contraception is recommended while taking this medication and for 6 months after the last dose. If your partner can get pregnant, use a condom during sex while taking this medication and for 4 months after the last dose. Do not breastfeed while taking this medication and for 2 weeks after the last dose. What side effects may I notice from receiving this medication? Side effects that you should report to your care team as soon as possible: Allergic reactions--skin rash, itching, hives, swelling of the face, lips, tongue, or throat Infection--fever, chills, cough, sore throat, wounds that don't heal, pain or trouble when passing urine, general feeling of discomfort or being unwell Liver injury--right upper belly pain, loss of appetite, nausea, light-colored stool, dark yellow or brown urine, yellowing skin or eyes, unusual weakness or fatigue Low red blood cell level--unusual weakness or fatigue, dizziness, headache, trouble breathing Muscle injury--unusual weakness or fatigue,  muscle pain, dark yellow or brown urine, decrease in amount of urine Painful swelling, warmth, or redness of the skin, blisters or sores at the infusion site Unusual bruising or bleeding Side effects that usually do not require medical attention (report these to your care team if they continue or are bothersome): Constipation Cough Diarrhea Fatigue Loss of appetite Nausea This list may not describe all possible side effects. Call your doctor for medical advice about side effects. You may report side effects to FDA at 1-800-FDA-1088. Where should I keep my medication? This medication is given in  a hospital or clinic. It will not be stored at home. NOTE: This sheet is a summary. It may not cover all possible information. If you have questions about this medicine, talk to your doctor, pharmacist, or health care provider.  2024 Elsevier/Gold Standard (2021-08-21 00:00:00)

## 2023-09-01 ENCOUNTER — Other Ambulatory Visit: Payer: PRIVATE HEALTH INSURANCE

## 2023-09-01 ENCOUNTER — Ambulatory Visit: Payer: PRIVATE HEALTH INSURANCE

## 2023-09-01 ENCOUNTER — Telehealth: Payer: Self-pay | Admitting: *Deleted

## 2023-09-01 ENCOUNTER — Ambulatory Visit: Payer: PRIVATE HEALTH INSURANCE | Admitting: Internal Medicine

## 2023-09-01 NOTE — Telephone Encounter (Signed)
 Called pt to see how he did with his treatment.  He was somewhat angry & states this drug makes him feel terrible & no energy & other drugs didn't & he doesn't have time for this & needs to get back to work.  Informed to let Dr Sherrod know if symptoms are not better in a few days or worse.  He was not going to listen to not take ibuprofen or goodie powders.  He knows his next appts & how to reach us  if needed.  Encouraged to call with any concerns.

## 2023-09-01 NOTE — Telephone Encounter (Signed)
-----   Message from Nurse Dawna HERO sent at 08/29/2023  4:32 PM EDT ----- Regarding: Dr. Sherrod 1st tx f.u call Dr. Sherrod 1st tx f/u call - zepzelca  - tolerated well

## 2023-09-03 ENCOUNTER — Other Ambulatory Visit: Payer: Self-pay

## 2023-09-09 ENCOUNTER — Other Ambulatory Visit: Payer: PRIVATE HEALTH INSURANCE

## 2023-09-09 ENCOUNTER — Ambulatory Visit: Payer: PRIVATE HEALTH INSURANCE | Admitting: Physician Assistant

## 2023-09-09 ENCOUNTER — Ambulatory Visit: Payer: PRIVATE HEALTH INSURANCE

## 2023-09-18 ENCOUNTER — Inpatient Hospital Stay: Payer: PRIVATE HEALTH INSURANCE

## 2023-09-18 ENCOUNTER — Telehealth: Payer: Self-pay | Admitting: Medical Oncology

## 2023-09-18 ENCOUNTER — Inpatient Hospital Stay: Payer: PRIVATE HEALTH INSURANCE | Admitting: Dietician

## 2023-09-18 ENCOUNTER — Inpatient Hospital Stay: Payer: PRIVATE HEALTH INSURANCE | Admitting: Internal Medicine

## 2023-09-18 NOTE — Telephone Encounter (Signed)
 I am in [place]  working and don't know when I will be back. I told someone I won't be at my appt today.  He  said he will call us  when he gets back in town. He is aware of his appt. in September.

## 2023-10-05 ENCOUNTER — Other Ambulatory Visit: Payer: Self-pay

## 2023-10-06 ENCOUNTER — Ambulatory Visit: Payer: PRIVATE HEALTH INSURANCE | Admitting: Internal Medicine

## 2023-10-06 ENCOUNTER — Other Ambulatory Visit: Payer: PRIVATE HEALTH INSURANCE

## 2023-10-06 ENCOUNTER — Ambulatory Visit: Payer: PRIVATE HEALTH INSURANCE

## 2023-10-07 NOTE — Progress Notes (Deleted)
 [facility] OFFICE PROGRESS NOTE  Patient, No Pcp Per No address on file  DIAGNOSIS: Extensive stage Small cell lung cancer. He presented with a large right upper lobe lung mass with mediastinal invasion, right pleural/subpleural involvement, left supraclavicular adenopathy, and possible pelvic peritoneal nodule. He was diagnosed in March 2025.   PRIOR THERAPY:  1)  Radiation to the large right lung mass and pelvis under the care of Dr. Patrcia last dose on 07/11/23  2) palliative systemic chemotherapy with carboplatin  for an AUC of 5 and etoposide  100 mg/m on days 1, 2, and 3 IV every 3 weeks and Imfinzi  once he completes his radiation to the chest. First dose on 05/12/23. He also receives Cosela  for myeloprotection. Status post 3 cycles of treatment. Discontinued due to disease progression  CURRENT THERAPY:  Second line chemotherapy with zepzelca  IV every 3 weeks starting on September 15, 2023 per patient request.   INTERVAL HISTORY: Eric Foley 53 y.o. male returns to clinic today for follow-up visit.  The patient was last seen on 08/19/2023.  In summary the patient was diagnosed with small cell lung cancer in March 2025.  The patient prioritizes his work schedule and was reluctant to start treatment.  However he did end up starting radiation and chemotherapy in the spring 2025 and tolerated it well.  However the patient had several missed and delayed treatments due to his work schedule.  The patient understands that he has an aggressive malignancy and dose delays and skipping treatment can affect outcomes.  Unfortunately when the patient was seen in late July 2025 his CT scan showed disease progression.  Therefore we discussed options at that time such as chemotherapy with Zepzelca  versus palliative care and hospice.  The patient was interested in treatment but wanted to delay treatment by a few weeks due to his work schedule.  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  The patient started his first cycle of treatment on 08/29/23.  He reports side effects related to treatment with low energy.  He was supposed to start cycle #2 on 8/28 but the patient deferred treatment until 10/09/2023 per patient request.  Overall he is feeling ***at this time.  He denies any fever, chills, night sweats, appetite changes, abdominal pain, breathing concerns specifically shortness of breath, cough, swelling, hemoptysis, chest pain, nausea, vomiting, diarrhea, constipation, headaches, vision changes.  He is here today for evaluation and repeat blood work before undergoing cycle #2.   MEDICAL HISTORY: Past Medical History:  Diagnosis Date   COPD (chronic obstructive pulmonary disease) (HCC)    History of kidney stones     ALLERGIES:  is allergic to penicillins, amoxicillin, ampicillin-sulbactam sodium, and durvalumab .  MEDICATIONS:  Current Outpatient Medications  Medication Sig Dispense Refill   albuterol  (VENTOLIN  HFA) 108 (90 Base) MCG/ACT inhaler Inhale 2 puffs into the lungs every 6 (six) hours as needed for wheezing or shortness of breath. (Patient not taking: Reported on 08/19/2023) 8 g 6   Aspirin-Acetaminophen (GOODYS BODY PAIN PO) Take by mouth 2 (two) times daily.     lidocaine -prilocaine  (EMLA ) cream Apply 1 Application topically as needed. 30 g 2   Potassium 99 MG TABS Take 99 mg by mouth 2 (two) times daily.     prochlorperazine  (COMPAZINE ) 10 MG tablet Take 1 tablet (10 mg total) by mouth every 6 (six) hours as needed. 30 tablet 2   Tiotropium  Bromide-Olodaterol (STIOLTO RESPIMAT ) 2.5-2.5 MCG/ACT AERS Inhale 2 puffs into the lungs daily. (Patient not taking: Reported on 08/19/2023) 4 g 11   No current facility-administered medications for this visit.    SURGICAL HISTORY:  Past Surgical History:  Procedure Laterality Date   BRONCHIAL BIOPSY  04/22/2023   Procedure: BRONCHOSCOPY, WITH  BIOPSY;  Surgeon: Shelah Lamar RAMAN, MD;  Location: [facility] ENDOSCOPY;  Service: Pulmonary;;   BRONCHIAL BRUSHINGS  04/22/2023   Procedure: BRONCHOSCOPY, WITH BRUSH BIOPSY;  Surgeon: Shelah Lamar RAMAN, MD;  Location: [facility] ENDOSCOPY;  Service: Pulmonary;;   FLEXIBLE BRONCHOSCOPY  04/22/2023   Procedure: ELLIOTT SIDE;  Surgeon: Shelah Lamar RAMAN, MD;  Location: [facility] ENDOSCOPY;  Service: Pulmonary;;   HEMOSTASIS CLIP PLACEMENT  04/22/2023   Procedure: CONTROL OF HEMORRHAGE, lung;  Surgeon: Shelah Lamar RAMAN, MD;  Location: [facility] ENDOSCOPY;  Service: Pulmonary;;   IR IMAGING GUIDED PORT INSERTION  05/16/2023   TONSILLECTOMY      REVIEW OF SYSTEMS:   Review of Systems  Constitutional: Negative for appetite change, chills, fatigue, fever and unexpected weight change.  HENT:   Negative for mouth sores, nosebleeds, sore throat and trouble swallowing.   Eyes: Negative for eye problems and icterus.  Respiratory: Negative for cough, hemoptysis, shortness of breath and wheezing.   Cardiovascular: Negative for chest pain and leg swelling.  Gastrointestinal: Negative for abdominal pain, constipation, diarrhea, nausea and vomiting.  Genitourinary: Negative for bladder incontinence, difficulty urinating, dysuria, frequency and hematuria.   Musculoskeletal: Negative for back pain, gait problem, neck pain and neck stiffness.  Skin: Negative for itching and rash.  Neurological: Negative for dizziness, extremity weakness, gait problem, headaches, light-headedness and seizures.  Hematological: Negative for adenopathy. Does not bruise/bleed easily.  Psychiatric/Behavioral: Negative for confusion, depression and sleep disturbance. The patient is not nervous/anxious.     PHYSICAL EXAMINATION:  There were no vitals taken for this visit.  ECOG PERFORMANCE STATUS: {CHL ONC ECOG [id number]  Physical Exam  Constitutional: Oriented to person, place, and time and well-developed, well-nourished, and in no distress. No distress.   HENT:  Head: Normocephalic and atraumatic.  Mouth/Throat: Oropharynx is clear and moist. No oropharyngeal exudate.  Eyes: Conjunctivae are normal. Right eye exhibits no discharge. Left eye exhibits no discharge. No scleral icterus.  Neck: Normal range of motion. Neck supple.  Cardiovascular: Normal rate, regular rhythm, normal heart sounds and intact distal pulses.   Pulmonary/Chest: Effort normal and breath sounds normal. No respiratory distress. No wheezes. No rales.  Abdominal: Soft. Bowel sounds are normal. Exhibits no distension and no mass. There is no tenderness.  Musculoskeletal: Normal range of motion. Exhibits no edema.  Lymphadenopathy:    No cervical adenopathy.  Neurological: Alert and oriented to person, place, and time. Exhibits normal muscle tone. Gait normal. Coordination normal.  Skin: Skin is warm and dry. No rash noted. Not diaphoretic. No erythema. No pallor.  Psychiatric: Mood, memory and judgment normal.  Vitals reviewed.  LABORATORY DATA: Lab Results  Component Value Date   WBC 3.7 (L) 08/29/2023   HGB 11.6 (L) 08/29/2023   HCT 34.6 (L) 08/29/2023   MCV 96.9 08/29/2023   PLT 222 08/29/2023      Chemistry      Component Value Date/Time   NA 141 08/29/2023 1112   K 4.0 08/29/2023 1112   CL 106 08/29/2023 1112   CO2 30 08/29/2023 1112   BUN 15 08/29/2023 1112   CREATININE 0.88 08/29/2023 1112      Component Value Date/Time  CALCIUM 9.2 08/29/2023 1112   ALKPHOS 74 08/29/2023 1112   AST 17 08/29/2023 1112   ALT 12 08/29/2023 1112   BILITOT 0.4 08/29/2023 1112       RADIOGRAPHIC STUDIES:  No results found.   ASSESSMENT/PLAN:  This is a very pleasant 53 year old Caucasian male diagnosed with at least stage IIIC Small cell lung cancer (T4, N3, M0).  He presented with a large right upper lobe lung mass with mediastinal invasion, right pleural/subpleural involvement, left supraclavicular adenopathy, and possible pelvic peritoneal nodule.  He was  diagnosed in March 2025.   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   He started palliative  systemic chemotherapy with carboplatin  for an AUC of 5 day 1 and etoposide  100 mg/m on day 1, 2, and 3 on 05/12/23. He is status post 3 cycles.  Immunotherapy was added starting from cycle #3 since he has completed his radiation.   Notably, the patient has not consistently received chemotherapy at the recommended intervals due to prioritization of work commitments, which may have contributed to disease progression.   [place].  This may offer clinical benefit but requires strict adherence to treatment intervals, which the patient expressed concerns about due to work-related travel.  Dr. Sherrod explained that is likely the most effective option.  The other option is systemic chemotherapy with Zepzelca  once every 3 weeks.  The patient has opted for treatment with Zepzelca .   Underwent his first cycle of treatment on 08/29/2023.  He reports significant fatigue following treatment.  He then missed his appointment on 09/18/2023 due to being out of town for work.  He opted to wait until his next scheduled appointment on 10/09/2023 to consider cycle #2.  The patient understands that it is not our recommendation to skip treatment and importance of adhering to the appropriate schedule.  Labs were reviewed.  Recommend that he ***cycle #2 today scheduled.  I will arrange for restaging CT scan prior to his next cycle of treatment.  We will see him for follow-up visit in 3 weeks before undergoing cycle #3.  The patient was advised to call immediately if she has any concerning symptoms in the interval. The patient voices understanding of current disease status and treatment options and is in agreement with the current care plan. All questions were answered. The patient knows to call the clinic with any problems,  questions or concerns. We can certainly see the patient much sooner if necessary        No orders of the defined types were placed in this encounter.    I spent {CHL ONC TIME VISIT - DTPQU:[id number]} counseling the patient face to face. The total time spent in the appointment was {CHL ONC TIME VISIT - DTPQU:[id number]}.  Znya Albino L Boy Delamater, PA-C 10/07/23

## 2023-10-09 ENCOUNTER — Inpatient Hospital Stay: Payer: PRIVATE HEALTH INSURANCE | Attending: Internal Medicine

## 2023-10-09 ENCOUNTER — Inpatient Hospital Stay: Payer: PRIVATE HEALTH INSURANCE | Admitting: Physician Assistant

## 2023-10-09 ENCOUNTER — Telehealth: Payer: Self-pay

## 2023-10-09 DIAGNOSIS — C3411 Malignant neoplasm of upper lobe, right bronchus or lung: Secondary | ICD-10-CM

## 2023-10-09 NOTE — Telephone Encounter (Signed)
 Attempted to contact patient regarding missed appointments today. Patient was recently in [place]  for work and was instructed to call the office upon his return; however, no message was received. The patient has been counseled on multiple occasions regarding the importance of attending his appointments.  Left a voicemail requesting the patient call the office to reschedule his appointments.

## 2023-10-10 ENCOUNTER — Encounter: Payer: Self-pay | Admitting: Internal Medicine

## 2023-10-10 NOTE — Telephone Encounter (Signed)
 Spoke with patient who reports he is currently in [place]  for work and will return home on October 2nd. He requests to have his port flushed with labs and a visit with Dr. Sherrod or [place], [place] on October 3rd or October 6th to discuss treatment options, as he will be in town those dates.  Patient stated that the new chemotherapy, Zepzelca , made him feel very sick with weakness, nausea, and "not wanting to get out of bed." He expressed that he does not wish to continue Zepzelca  and requests to cancel his October 9th appointments. He would like to keep his other appointments until after speaking with Dr. Sherrod or [facility] about alternative treatment options. Patient mentioned Bite therapy when he returns from his trip at the end of October. He reports he will be leaving on October 7th and will not return until October 30th.  Information will be relayed to Dr. Sherrod and [place], [place] for review.

## 2023-10-16 ENCOUNTER — Encounter: Payer: Self-pay | Admitting: Internal Medicine

## 2023-10-16 ENCOUNTER — Telehealth: Payer: Self-pay | Admitting: Medical Oncology

## 2023-10-16 DIAGNOSIS — C3411 Malignant neoplasm of upper lobe, right bronchus or lung: Secondary | ICD-10-CM

## 2023-10-16 NOTE — Telephone Encounter (Signed)
 Pt wants a referral to [facility] for BITE therapy consultation. He has an appointment for port flush only on October 3rd.

## 2023-10-16 NOTE — Telephone Encounter (Signed)
 Faxed referral , demographics , path report, progress note to Dr Merrianne.

## 2023-10-16 NOTE — Telephone Encounter (Signed)
 Received direct call from Diane with Dr. Sherrod office at [facility]. Asking for fax # to send referral to Dr. Merrianne.  Provided clinic fax #. Verbalizes understanding and expresses appreciation.     Call back #  [phone]

## 2023-10-24 ENCOUNTER — Encounter: Payer: Self-pay | Admitting: Internal Medicine

## 2023-10-24 ENCOUNTER — Inpatient Hospital Stay: Payer: Self-pay | Attending: Internal Medicine

## 2023-10-24 DIAGNOSIS — Z452 Encounter for adjustment and management of vascular access device: Secondary | ICD-10-CM | POA: Diagnosis not present

## 2023-10-24 DIAGNOSIS — C3411 Malignant neoplasm of upper lobe, right bronchus or lung: Secondary | ICD-10-CM | POA: Insufficient documentation

## 2023-10-24 NOTE — Telephone Encounter (Signed)
 Tiffany, Patient demographic information is incorrect. Please see corrected information below. Please update patient chart.   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 He is leaving on Monday 10/27/23 and will not return until 10/29 or 10/30. Not available to schedule until 11/21/23.   Schedulers, Please schedule patient 11/21/23 8 am Consult Visit Dr. Merrianne. Patient confirms this appt date and time. Please mail appointment letter and campus map/driving directions.  Medical Records, Please request outside images to be pushed to our PACS system 12/16/2016 CT chest and all PET scans, MRI's  and CT scans from 03/19/23 to present. Ollie LOISE Ar, RN

## 2023-10-24 NOTE — Telephone Encounter (Signed)
 Appointment scheduled per below. Thanks.

## 2023-10-30 ENCOUNTER — Ambulatory Visit: Payer: PRIVATE HEALTH INSURANCE | Admitting: Internal Medicine

## 2023-10-30 ENCOUNTER — Other Ambulatory Visit: Payer: PRIVATE HEALTH INSURANCE

## 2023-10-30 ENCOUNTER — Ambulatory Visit: Payer: PRIVATE HEALTH INSURANCE

## 2023-11-07 ENCOUNTER — Other Ambulatory Visit: Payer: Self-pay

## 2023-11-11 ENCOUNTER — Telehealth: Payer: Self-pay

## 2023-11-11 NOTE — Telephone Encounter (Signed)
 Patient called and reported he has a consultation scheduled at [facility] on 11/21/23 for possible BITE therapy. Informed the patient that his appointments for labs, provider visit, and infusion on 11/20/23 will be canceled. Advised the patient to call back after the consultation at [facility] to inform us  of the treatment plan. Patient voiced understanding.

## 2023-11-18 ENCOUNTER — Telehealth: Payer: Self-pay | Admitting: Internal Medicine

## 2023-11-18 NOTE — Telephone Encounter (Signed)
 Called to reschedule patient for a different day/decouple since its treatment. The patient said that he will not be doing treatment anymore and that those appointments were supposed to be canceled.

## 2023-11-20 ENCOUNTER — Inpatient Hospital Stay: Payer: PRIVATE HEALTH INSURANCE

## 2023-11-20 ENCOUNTER — Other Ambulatory Visit: Payer: PRIVATE HEALTH INSURANCE

## 2023-11-20 ENCOUNTER — Ambulatory Visit: Payer: PRIVATE HEALTH INSURANCE | Admitting: Internal Medicine

## 2023-11-30 ENCOUNTER — Emergency Department (HOSPITAL_COMMUNITY)

## 2023-11-30 ENCOUNTER — Encounter (HOSPITAL_COMMUNITY): Payer: Self-pay | Admitting: *Deleted

## 2023-11-30 ENCOUNTER — Emergency Department (HOSPITAL_COMMUNITY)
Admission: EM | Admit: 2023-11-30 | Discharge: 2023-11-30 | Disposition: A | Discharge status: Home / Self Care | Attending: Emergency Medicine | Admitting: Emergency Medicine

## 2023-11-30 ENCOUNTER — Other Ambulatory Visit: Payer: Self-pay

## 2023-11-30 DIAGNOSIS — Z85118 Personal history of other malignant neoplasm of bronchus and lung: Secondary | ICD-10-CM | POA: Diagnosis not present

## 2023-11-30 DIAGNOSIS — N492 Inflammatory disorders of scrotum: Secondary | ICD-10-CM | POA: Diagnosis present

## 2023-11-30 DIAGNOSIS — Z7982 Long term (current) use of aspirin: Secondary | ICD-10-CM | POA: Insufficient documentation

## 2023-11-30 DIAGNOSIS — L02215 Cutaneous abscess of perineum: Secondary | ICD-10-CM

## 2023-11-30 LAB — I-STAT CG4 LACTIC ACID, ED: Lactic Acid, Venous: 0.8 mmol/L (ref 0.5–1.9)

## 2023-11-30 LAB — CBC WITH DIFFERENTIAL/PLATELET
Abs Immature Granulocytes: 0.02 K/uL (ref 0.00–0.07)
Basophils Absolute: 0 K/uL (ref 0.0–0.1)
Basophils Relative: 0 %
Eosinophils Absolute: 0.1 K/uL (ref 0.0–0.5)
Eosinophils Relative: 1 %
HCT: 39.6 % (ref 39.0–52.0)
Hemoglobin: 12.9 g/dL — ABNORMAL LOW (ref 13.0–17.0)
Immature Granulocytes: 0 %
Lymphocytes Relative: 7 %
Lymphs Abs: 0.4 K/uL — ABNORMAL LOW (ref 0.7–4.0)
MCH: 31.4 pg (ref 26.0–34.0)
MCHC: 32.6 g/dL (ref 30.0–36.0)
MCV: 96.4 fL (ref 80.0–100.0)
Monocytes Absolute: 1 K/uL (ref 0.1–1.0)
Monocytes Relative: 19 %
Neutro Abs: 4 K/uL (ref 1.7–7.7)
Neutrophils Relative %: 73 %
Platelets: 193 K/uL (ref 150–400)
RBC: 4.11 MIL/uL — ABNORMAL LOW (ref 4.22–5.81)
RDW: 12.2 % (ref 11.5–15.5)
WBC: 5.6 K/uL (ref 4.0–10.5)
nRBC: 0 % (ref 0.0–0.2)

## 2023-11-30 LAB — COMPREHENSIVE METABOLIC PANEL WITH GFR
ALT: 11 U/L (ref 0–44)
AST: 23 U/L (ref 15–41)
Albumin: 4 g/dL (ref 3.5–5.0)
Alkaline Phosphatase: 95 U/L (ref 38–126)
Anion gap: 11 (ref 5–15)
BUN: 15 mg/dL (ref 6–20)
CO2: 25 mmol/L (ref 22–32)
Calcium: 9.2 mg/dL (ref 8.9–10.3)
Chloride: 102 mmol/L (ref 98–111)
Creatinine, Ser: 0.92 mg/dL (ref 0.61–1.24)
GFR, Estimated: >60 mL/min (ref >60)
Glucose, Bld: 88 mg/dL (ref 70–99)
Potassium: 3.9 mmol/L (ref 3.5–5.1)
Sodium: 138 mmol/L (ref 135–145)
Total Bilirubin: 0.4 mg/dL (ref 0.0–1.2)
Total Protein: 7.3 g/dL (ref 6.5–8.1)

## 2023-11-30 MED ORDER — LIDOCAINE HCL 2 % IJ SOLN
10.0000 mL | Freq: Once | INTRAMUSCULAR | Status: AC
  Administered 2023-11-30: 200 mg via INTRADERMAL
  Filled 2023-11-30: qty 20

## 2023-11-30 MED ORDER — CLINDAMYCIN HCL 300 MG PO CAPS: 300.0000 mg | ORAL_CAPSULE | Freq: Three times a day (TID) | ORAL | 0 refills | Status: AC

## 2023-11-30 MED ORDER — CLINDAMYCIN PHOSPHATE 600 MG/50ML IV SOLN
600.0000 mg | Freq: Once | INTRAVENOUS | Status: AC
  Administered 2023-11-30: 600 mg via INTRAVENOUS
  Filled 2023-11-30: qty 50

## 2023-11-30 MED ORDER — HEPARIN SOD (PORK) LOCK FLUSH 100 UNIT/ML IV SOLN
500.0000 [IU] | Freq: Once | INTRAVENOUS | Status: AC
  Administered 2023-11-30: 500 [IU]
  Filled 2023-11-30: qty 5

## 2023-11-30 MED ORDER — MORPHINE SULFATE (PF) 4 MG/ML IV SOLN
4.0000 mg | Freq: Once | INTRAVENOUS | Status: DC
Start: 2023-11-30 — End: 2023-11-30

## 2023-11-30 MED ORDER — SODIUM CHLORIDE 0.9 % IV BOLUS
1000.0000 mL | Freq: Once | INTRAVENOUS | Status: AC
  Administered 2023-11-30: 1000 mL via INTRAVENOUS

## 2023-11-30 MED ORDER — DOXYCYCLINE HYCLATE 100 MG PO CAPS: 100.0000 mg | ORAL_CAPSULE | Freq: Two times a day (BID) | ORAL | 0 refills | Status: DC

## 2023-11-30 MED ORDER — DOXYCYCLINE HYCLATE 100 MG PO CAPS
100.0000 mg | ORAL_CAPSULE | Freq: Two times a day (BID) | ORAL | 0 refills | Status: AC
Start: 2023-11-30 — End: ?

## 2023-11-30 NOTE — ED Provider Notes (Signed)
 [place] EMERGENCY DEPARTMENT AT [facility] Provider Note   CSN: [id number] Arrival date & time: 11/30/23  1456     Patient presents with: Abscess   Eric Foley is a 53 y.o. male history of lung cancer in remission, here presenting with concern for possible scrotal abscess.  Patient states that he noticed an area behind the scrotum that was tender for several days.  He states that today he went to the bathroom and it burst open and he noticed purulent drainage.  Patient denies any history of inflammatory bowel disease and denies any history of previous scrotal abscess.   The history is provided by the patient.       Prior to Admission medications   Medication Sig Start Date End Date Taking? Authorizing Provider  albuterol  (VENTOLIN  HFA) 108 (90 Base) MCG/ACT inhaler Inhale 2 puffs into the lungs every 6 (six) hours as needed for wheezing or shortness of breath. Patient not taking: Reported on 08/19/2023 06/12/23   Shelah Lamar RAMAN, MD  Aspirin-Acetaminophen (GOODYS BODY PAIN PO) Take by mouth 2 (two) times daily.    [provider]  lidocaine -prilocaine  (EMLA ) cream Apply 1 Application topically as needed. 05/02/23   Heilingoetter, Cassandra L, PA-C  Potassium 99 MG TABS Take 99 mg by mouth 2 (two) times daily.    [provider]  prochlorperazine  (COMPAZINE ) 10 MG tablet Take 1 tablet (10 mg total) by mouth every 6 (six) hours as needed. 07/31/23   Heilingoetter, Cassandra L, PA-C  Tiotropium Bromide-Olodaterol (STIOLTO RESPIMAT ) 2.5-2.5 MCG/ACT AERS Inhale 2 puffs into the lungs daily. Patient not taking: Reported on 08/19/2023 06/12/23   Shelah Lamar RAMAN, MD    Allergies: Penicillins, Amoxicillin, Ampicillin-sulbactam sodium, and Durvalumab     Review of Systems  Genitourinary:  Positive for scrotal swelling.  All other systems reviewed and are negative.   Updated Vital Signs BP 139/88   Pulse (!) 105   Temp 98.6 F (37 C)   Resp 16   Wt 80.3  kg   SpO2 95%   BMI 24.01 kg/m   Physical Exam Vitals and nursing note reviewed.  HENT:     Head: Normocephalic.     Nose: Nose normal.     Mouth/Throat:     Mouth: Mucous membranes are moist.  Eyes:     Extraocular Movements: Extraocular movements intact.     Pupils: Pupils are equal, round, and reactive to light.  Cardiovascular:     Rate and Rhythm: Normal rate and regular rhythm.     Pulses: Normal pulses.     Heart sounds: Normal heart sounds.  Pulmonary:     Effort: Pulmonary effort is normal.     Breath sounds: Normal breath sounds.  Abdominal:     General: Abdomen is flat.     Palpations: Abdomen is soft.  Genitourinary:    Comments: Patient has abscess behind the scrotum that is draining already.  It measures about 5 x 5 cm.  No obvious scrotal edema.  No obvious signs of Fournier's gangrene Musculoskeletal:        General: Normal range of motion.     Cervical back: Normal range of motion and neck supple.  Skin:    General: Skin is warm.     Capillary Refill: Capillary refill takes less than 2 seconds.  Neurological:     General: No focal deficit present.     Mental Status: He is alert and oriented to person, place, and time.  Psychiatric:  Mood and Affect: Mood normal.        Behavior: Behavior normal.     (all labs ordered are listed, but only abnormal results are displayed) Labs Reviewed  CULTURE, BLOOD (ROUTINE X 2)  CULTURE, BLOOD (ROUTINE X 2)  CBC WITH DIFFERENTIAL/PLATELET  COMPREHENSIVE METABOLIC PANEL WITH GFR  I-STAT CG4 LACTIC ACID, ED    EKG: None  Radiology: No results found.   Procedures   INCISION AND DRAINAGE Performed by: Alm VEAR Cave Consent: Verbal consent obtained. Risks and benefits: risks, benefits and alternatives were discussed Type: abscess  Body area: perineum   Anesthesia: local infiltration  Incision was made with a scalpel.  Local anesthetic: lidocaine  2% no epinephrine   Anesthetic total: 10  ml  Complexity: complex Blunt dissection to break up loculations  Drainage: purulent  Drainage amount: moderate   Packing material: none  Patient tolerance: Patient tolerated the procedure well with no immediate complications.    Medications Ordered in the ED  sodium chloride  0.9 % bolus 1,000 mL (has no administration in time range)  clindamycin (CLEOCIN) IVPB 600 mg (has no administration in time range)                                    Medical Decision Making Eric Foley is a 53 y.o. male here presenting with possible scrotal abscess.  Patient appears to have an abscess right behind the scrotum on my exam that is draining already. Will get ultrasound to see if it is extending into the scrotum or if it is deeper in the perineum.  Patient does not have any signs of Fournier's gangrene currently.  I plan to get CBC and CMP and lactate and culture and give antibiotics empirically.  Patient states that he has multiple drug allergies but has tolerated clindamycin in the past  5:48 PM White blood cell count is 5.  Lactate is 0.8.  Ultrasound showed no scrotal abscess but patient does have perineal abscess.  It is draining already but I was able to open this up further.  Wound culture is sent.  Patient will be discharged home with doxycycline and clindamycin.  Told him to return in 48 hours for wound check  Problems Addressed: Perineal abscess: acute illness or injury  Amount and/or Complexity of Data Reviewed Labs: ordered. Decision-making details documented in ED Course. Radiology: ordered and independent interpretation performed. Decision-making details documented in ED Course.  Risk Prescription drug management.     Final diagnoses:  None    ED Discharge Orders     None          Cave Alm Macho, MD 11/30/23 1750

## 2023-11-30 NOTE — ED Triage Notes (Signed)
 Here by POV from home for scrotal abscess. First noticed today at 1400 when it was wet and draining/ bleeding, and TTP. Pinpoints to mid posterior scrotum. Denies fever, NVD, or urinary sx. H/o cancer. Stopped chemo. Rates 5/10.  Alert, NAD, calm, interactive, resps e/u, speaking in clear complete sentences, skin W&D, steady gait.

## 2023-11-30 NOTE — Discharge Instructions (Addendum)
 As we discussed, you have a skin abscess  I have prescribed clindamycin and clindamycin   Please return in 48 hours for wound check.  I have sent off wound culture and you should see the result when you come back in 48 hours  Take Tylenol Motrin for pain  Return to ER if you have severe pain or purulent drainage

## 2023-12-01 ENCOUNTER — Telehealth: Payer: Self-pay | Admitting: *Deleted

## 2023-12-01 ENCOUNTER — Other Ambulatory Visit: Payer: Self-pay | Admitting: *Deleted

## 2023-12-01 DIAGNOSIS — C3411 Malignant neoplasm of upper lobe, right bronchus or lung: Secondary | ICD-10-CM

## 2023-12-01 NOTE — Telephone Encounter (Signed)
 Spoke with pt. CT and MRI scan appts. 12/08/23 he is aware. Pt. Also aware [employer] does not cover scans here but [facility] is his other insurance and per Wetzel Memos no shara is required.

## 2023-12-03 LAB — AEROBIC CULTURE W GRAM STAIN (SUPERFICIAL SPECIMEN)

## 2023-12-04 ENCOUNTER — Encounter: Payer: Self-pay | Admitting: Internal Medicine

## 2023-12-04 ENCOUNTER — Telehealth (HOSPITAL_BASED_OUTPATIENT_CLINIC_OR_DEPARTMENT_OTHER): Payer: Self-pay

## 2023-12-04 NOTE — Telephone Encounter (Signed)
 Post ED Visit - Positive Culture Follow-up  Culture report reviewed by antimicrobial stewardship pharmacist: [name] []  [name], Pharm.D. []  Venetia Gully, Pharm.D., BCPS AQ-ID []  Garrel Crews, Pharm.D., BCPS []  Almarie Lunger, Pharm.D., BCPS []  [place], [address].D., BCPS, AAHIVP []  Rosaline Bihari, Pharm.D., BCPS, AAHIVP []  Vernell Meier, PharmD, BCPS []  Latanya Hint, PharmD, BCPS []  Donald Medley, PharmD, BCPS []  Rocky Bold, PharmD []  Dorothyann Alert, PharmD, BCPS []  Morene Babe, PharmD  [name] [x]  [name], PharmD []  [name], PharmD []  [name], PharmD []  [name], Rph []  [name], PharmD []  [name], PharmD []  [name], PharmD []  [name], PharmD []  [name], PharmD []  [name], PharmD []  [name], PharmD []  [name], PharmD []  [name], PharmD   Positive wound culture Treated with Clindamycin and Doxycycline, organism sensitive to the same and no further patient follow-up is required at this time.  Eric Foley 12/04/2023, 9:11 AM

## 2023-12-08 ENCOUNTER — Ambulatory Visit (HOSPITAL_COMMUNITY): Payer: Self-pay

## 2023-12-08 ENCOUNTER — Ambulatory Visit (HOSPITAL_COMMUNITY): Admission: RE | Admit: 2023-12-08 | Payer: Self-pay | Source: Ambulatory Visit

## 2023-12-11 ENCOUNTER — Ambulatory Visit: Payer: PRIVATE HEALTH INSURANCE | Admitting: Internal Medicine

## 2023-12-11 ENCOUNTER — Other Ambulatory Visit: Payer: PRIVATE HEALTH INSURANCE

## 2023-12-11 ENCOUNTER — Inpatient Hospital Stay: Payer: PRIVATE HEALTH INSURANCE

## 2023-12-28 ENCOUNTER — Other Ambulatory Visit: Payer: Self-pay

## 2024-01-01 ENCOUNTER — Inpatient Hospital Stay: Admitting: Physician Assistant

## 2024-01-01 ENCOUNTER — Inpatient Hospital Stay

## 2024-01-05 ENCOUNTER — Encounter (HOSPITAL_BASED_OUTPATIENT_CLINIC_OR_DEPARTMENT_OTHER): Payer: Self-pay

## 2024-01-06 ENCOUNTER — Other Ambulatory Visit: Payer: Self-pay

## 2024-01-09 ENCOUNTER — Other Ambulatory Visit: Payer: Self-pay | Admitting: Physician Assistant

## 2024-01-09 NOTE — H&P (Signed)
 ------------------------------------------------------------------------------- Attestation signed by Elsie Reyes Jerry, MD at 01/09/2024  6:50 PM I performed a shared visit with PA-C Crowe. I interviewed and evaluated the patient and I discussed the plan of care with the patient. I performed a substantive portion of the medical decision making.  I approved the management plan for the number and complexity of problems addressed for this  encounter and take responsibility for the plan with its inherent risk of complications. I discussed the risks and benefits of tarlatamab treatment with the patient. The patient was given the opportunity to ask questions and wishes to proceed.  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 male with PMHx of kidney stones, COPD and ES-SCLC. He was initially presented in 02/2023. CTA revealed large right upper lobe lung mass. PET with mediastinal invasion, supraclavicular adenopathy and peritoneal nodule in distal esophagus. S/p bronchoscopy 04/22/2023 with pathology consistent for SCLC. He was started on carboplatin /etoposide  x 4 cycles and additionally had radiation to right hilar mass/mediastinum (50 Gy in 5 fx). Durvalumab  was added to C3. CT abdomen pelvis with mixed response (improvement in right sided mediastinal lung mass however increase in abdominal disease including bilateral adrenal masses. He was transitioned to lurbinectidin which was poorly tolerated. Decision was made to pursue tarlatamab. He will be admitted to [facility] for 48 hr observation for IEC toxicity following C1 D1 Tarlatamab.  Current Status: [name] presents for his scheduled  admission. Feeling overall well. Denies any chest pain or shortness of breath. Denies any headaches, dizziness or visual changes. Has been working. Denies any nausea/vomiting.  No concerns about treatment. Confirms full code status. No additional questions or concerns.  Review of Systems A complete ROS was performed with pertinent positives/negatives noted in the HPI. The remainder of the ROS are negative.  Oncology History: Oncology History  Small cell carcinoma of right lung    (CMD)  12/01/2023 Initial Diagnosis   Small cell carcinoma of right lung    (CMD)   01/09/2024 -  Supportive Treatment   Adult Peripheral IV and Central Venous Access Orders Plan Provider: Shelli Aloysius Snuffer, MD   01/09/2024 -  Oncology Treatment   Protocol Tarlatamab-dlle Every 28 Days  Chemotherapy/Immunotherapy Medications tarlatamab-dlle 1 mg (IMDELLTRA) chemo infusion, 1 mg, intravenous  [Plan is still active]   Lung cancer metastatic to brain    (CMD)  12/31/2023 Initial Diagnosis   Lung cancer metastatic to brain    (CMD)   12/31/2023 -  Radiation   RADIATION Treatment Details (Noted on 12/31/2023) Goal: Curative Site Technique  Brain SRS 3D Photons   No radiation treatments to show. (Treatments may have been administered in another system.)      Past Medical History: Eric Foley has a past medical history of Lung cancer    (CMD) (03/19/23).  Past Surgical History: Eric Foley has a past surgical history that includes Permacath.  Family History: Ava Deguire Hershey's family history is not on file.  Social History: JAQUAVION MCCANNON reports that he has been smoking cigarettes. He has never used smokeless tobacco. He reports that he does not drink alcohol and does not use drugs.  Allergies: Allergies[1]  Medications: Current Medications[2]   Objective: No data found.  Labs: Recent Results (from the past 24 hours)  Comprehensive Metabolic  Panel   Collection Time: 01/09/24   9:00 AM  Result Value Ref Range   Sodium 140 136 - 145 mmol/L   Potassium 4.0 3.4 - 4.5 mmol/L   Chloride 106 98 - 107 mmol/L   CO2 29 21 - 31 mmol/L   Anion Gap 5 (L) 6 - 14 mmol/L   Glucose, Random 178 (H) 70 - 99 mg/dL   Blood Urea Nitrogen (BUN) 23 7 - 25 mg/dL   Creatinine 8.98 9.29 - 1.30 mg/dL   eGFR 88 >40 fO/fpw/8.26f7   Albumin 4.0 3.5 - 5.7 g/dL   Total Protein 6.5 6.4 - 8.9 g/dL   Bilirubin, Total 0.4 0.3 - 1.0 mg/dL   Alkaline Phosphatase (ALP) 68 34 - 104 U/L   Aspartate Aminotransferase (AST) 15 13 - 39 U/L   Alanine Aminotransferase (ALT) 12 7 - 52 U/L   Calcium 8.8 8.6 - 10.3 mg/dL   BUN/Creatinine Ratio    Magnesium   Collection Time: 01/09/24  9:00 AM  Result Value Ref Range   Magnesium 2.0 1.9 - 2.7 mg/dL  CBC with Differential   Collection Time: 01/09/24  9:00 AM  Result Value Ref Range   WBC 4.20 (L) 4.40 - 11.00 10*3/uL   RBC 4.09 (L) 4.50 - 5.90 10*6/uL   Hemoglobin 13.2 (L) 14.0 - 17.5 g/dL   Hematocrit 61.5 (L) 58.4 - 50.4 %   Mean Corpuscular Volume (MCV) 93.8 80.0 - 96.0 fL   Mean Corpuscular Hemoglobin (MCH) 32.3 27.5 - 33.2 pg   Mean Corpuscular Hemoglobin Conc (MCHC) 34.5 33.0 - 37.0 g/dL   Red Cell Distribution Width (RDW) 14.8 12.3 - 17.0 %   Platelet Count (PLT) 178 150 - 450 10*3/uL   Mean Platelet Volume (MPV) 6.4 (L) 6.8 - 10.2 fL   Neutrophils % 80 %   Lymphocytes % 6 %   Monocytes % 12 %   Eosinophils % 2 %   Basophils % 1 %   nRBC % 0 %   Neutrophils Absolute 3.30 1.80 - 7.80 10*3/uL   Lymphocytes # 0.20 (L) 1.00 - 4.80 10*3/uL   Monocytes # 0.50 0.00 - 0.80 10*3/uL   Eosinophils # 0.10 0.00 - 0.50 10*3/uL   Basophils # 0.00 0.00 - 0.20 10*3/uL   nRBC Absolute 0.00 <=0.00 10*3/uL    Physical Exam: Constitutional: Well developed 53 y.o. male in no acute distress. HEENT: Head is normocephalic and atraumatic; PERRL, anicteric sclera, conjunctiva without injection; oropharynx pink & moist without mucositis or  thrush. Respiratory: CTAB with no wheezes, rhonchi, or rales. Non-labored.  Cardiac: Regular rate and rhythm with no murmurs, rubs, or gallops. Gastrointestinal: Bowel sounds normoactive. Abdomen soft, non-tender, non-distended.  Extremities:  No cyanosis, edema or erythema noted in extremities. Neuro: Alert and oriented x3. Moves all extremities.  Skin: Warm and dry without bruising or rashes Psych: Normal mood and affect.   Imaging: Radiology Results (last 72 hours)     ** No results found for the last 72 hours. **         Assessment & Plan: BERTHEL BAGNALL is a 53 y.o. 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  #ES-SCLC OP Oncologist: Dr. Rutha Jewels OP Radiation Oncologist: Dr. Ozell Donalds Diagnosed in 04.2025 with bronchoscopy. S/p 4 cycles of carbo/etop with durvalumab  added C3. Scans noted increase in abdominal disease including bilateral adrenal masses. He was transitioned to lurbinectidin which was poorly tolerated. MRI brain 12/23/23 noted brian metastases . Underwent GKRS 01/01/24.Decision was made  to pursue tarlatamab. He will be admitted to [facility] for 48 hr observation for IEC toxicity following C1 D1 Tarlatamab.   Plan: - CRS monitoring Q4hrs and ICANs monitoring q8hrs - Monitor daily labs including CBC, and Mg - Continue prednisone  10 mg daily for bilateral adrenal mets  # Chronic Conditions: None  Access - [facility] PAC - Catheter care maintenance ordered   FEN  - Fluids: PO Intake - Electrolytes: replaced per electrolyte replacement protocol  - Nutrition: Regular Diet   PPX - DVT ppx:  Lovenox - GERD: NA - Bowel regimen: NA   Ethics:  Full Code, per patient with adequate decision making capacity. Dispo: Admit to [facility], Dr. Lysbeth attending. Discharge home upon completion of  observation period; anticipated date of 01/11/24.   This is a shared visit with Dr. Lysbeth. Electronically signed by:  Norlene Candice Ned, PA-C, 01/09/2024 11:56  AM        [1] Allergies Allergen Reactions   Penicillins Anaphylaxis, Rash and Swelling    Product containing penicillin (product)  amoxicillin   Ampicillin-Sulbactam Rash   Durvalumab  Other (See Comments)    Pt. complained of lower back pain, rates pain 3-4 out of 10 and feeling hot but not diaphoretic. Medicated with diphenhydramine  and solumedrol. Able to complete infusion. See progress notes 07/28/23 for further information.  [2] No current facility-administered medications for this encounter.   Current Outpatient Medications  Medication Sig Dispense Refill   aspirin/acetaminophen (GOODY'S BACK AND BODY PAIN ORAL) Take 1 packet by mouth 2 (two) times a day as needed.     DAILY MULTI-VITAMIN ORAL Take 1 tablet by mouth daily.     predniSONE  (DELTASONE ) 10 mg tablet Take 1 tablet (10 mg total) by mouth daily. 30 tablet 2   Facility-Administered Medications Ordered in Other Encounters  Medication Dose Route Frequency Provider Last Rate Last Admin   albuterol  HFA (PROVENTIL  HFA;VENTOLIN  HFA;PROAIR  HFA) 90 mcg/actuation inhaler 2 puff  2 puff inhalation Q4H PRN Asberry Karna Cory, PA-C       albuterol  sulfate 2.5 mg/0.5 mL nebulizer solution 2.5 mg  2.5 mg nebulization Q4H PRN Asberry Karna Cory, PA-C       diphenhydrAMINE  (BENADRYL ) injection 50 mg  50 mg intravenous Once PRN Asberry Karna Cory, PA-C       EPINEPHrine  (EPIPEN ) 0.3 mg/0.3 mL injection syringe 0.3 mg  0.3 mg intramuscular PRN Asberry Karna Cory, PA-C       famotidine  (Pepcid ) injection 20 mg  20 mg intravenous Once PRN Asberry Karna Cory, PA-C       methylPREDNISolone  sodium succinate (PF) (SOLU-Medrol ) injection 40 mg  40 mg intravenous Once PRN Asberry Karna Cory, PA-C       sodium chloride  (bolus) 0.9 % bolus 1,000 mL  1,000 mL intravenous Once Asberry Karna Cory, PA-C       sodium chloride  (bolus) 0.9 % bolus 500 mL  500 mL intravenous Once PRN Asberry Karna Cory, PA-C        tarlatamab-dlle 1 mg (IMDELLTRA) chemo infusion  1 mg intravenous Once Asberry Karna Cory, PA-C

## 2024-02-13 ENCOUNTER — Telehealth: Payer: Self-pay | Admitting: Internal Medicine

## 2024-02-13 NOTE — Telephone Encounter (Signed)
 Left VM regarding Monday

## 2024-02-16 ENCOUNTER — Inpatient Hospital Stay: Admitting: Internal Medicine

## 2024-02-16 ENCOUNTER — Inpatient Hospital Stay

## 2024-02-18 ENCOUNTER — Encounter: Payer: Self-pay | Admitting: Internal Medicine
